# Patient Record
Sex: Female | Born: 1995 | Race: White | Hispanic: No | Marital: Single | Smoking: Former smoker
Health system: Southern US, Community
[De-identification: ages and names within clinical notes are randomized; demographics above are authoritative.]

## PROBLEM LIST (undated history)

## (undated) DIAGNOSIS — T7840XA Allergy, unspecified, initial encounter: Secondary | ICD-10-CM

## (undated) DIAGNOSIS — R87619 Unspecified abnormal cytological findings in specimens from cervix uteri: Secondary | ICD-10-CM

## (undated) DIAGNOSIS — K589 Irritable bowel syndrome without diarrhea: Secondary | ICD-10-CM

## (undated) DIAGNOSIS — L509 Urticaria, unspecified: Secondary | ICD-10-CM

## (undated) DIAGNOSIS — F32A Depression, unspecified: Secondary | ICD-10-CM

## (undated) DIAGNOSIS — T783XXA Angioneurotic edema, initial encounter: Secondary | ICD-10-CM

## (undated) DIAGNOSIS — F419 Anxiety disorder, unspecified: Secondary | ICD-10-CM

## (undated) DIAGNOSIS — R011 Cardiac murmur, unspecified: Secondary | ICD-10-CM

## (undated) DIAGNOSIS — F329 Major depressive disorder, single episode, unspecified: Secondary | ICD-10-CM

## (undated) HISTORY — PX: LEEP: SHX91

## (undated) HISTORY — DX: Major depressive disorder, single episode, unspecified: F32.9

## (undated) HISTORY — DX: Irritable bowel syndrome without diarrhea: K58.9

## (undated) HISTORY — DX: Anxiety disorder, unspecified: F41.9

## (undated) HISTORY — PX: COLPOSCOPY: SHX161

## (undated) HISTORY — PX: WRIST SURGERY: SHX841

## (undated) HISTORY — DX: Urticaria, unspecified: L50.9

## (undated) HISTORY — DX: Depression, unspecified: F32.A

## (undated) HISTORY — DX: Angioneurotic edema, initial encounter: T78.3XXA

## (undated) HISTORY — DX: Allergy, unspecified, initial encounter: T78.40XA

## (undated) HISTORY — DX: Unspecified abnormal cytological findings in specimens from cervix uteri: R87.619

## (undated) HISTORY — DX: Cardiac murmur, unspecified: R01.1

---

## 2015-12-03 DIAGNOSIS — Z3040 Encounter for surveillance of contraceptives, unspecified: Secondary | ICD-10-CM | POA: Insufficient documentation

## 2015-12-03 DIAGNOSIS — L709 Acne, unspecified: Secondary | ICD-10-CM | POA: Insufficient documentation

## 2015-12-03 DIAGNOSIS — R198 Other specified symptoms and signs involving the digestive system and abdomen: Secondary | ICD-10-CM | POA: Insufficient documentation

## 2015-12-12 DIAGNOSIS — B373 Candidiasis of vulva and vagina: Secondary | ICD-10-CM | POA: Insufficient documentation

## 2015-12-12 DIAGNOSIS — Z8742 Personal history of other diseases of the female genital tract: Secondary | ICD-10-CM | POA: Insufficient documentation

## 2015-12-12 DIAGNOSIS — B3731 Acute candidiasis of vulva and vagina: Secondary | ICD-10-CM | POA: Insufficient documentation

## 2015-12-30 DIAGNOSIS — R202 Paresthesia of skin: Secondary | ICD-10-CM | POA: Insufficient documentation

## 2015-12-30 DIAGNOSIS — F419 Anxiety disorder, unspecified: Secondary | ICD-10-CM | POA: Insufficient documentation

## 2015-12-30 DIAGNOSIS — M542 Cervicalgia: Secondary | ICD-10-CM | POA: Insufficient documentation

## 2015-12-30 DIAGNOSIS — F321 Major depressive disorder, single episode, moderate: Secondary | ICD-10-CM | POA: Insufficient documentation

## 2016-01-01 ENCOUNTER — Ambulatory Visit
Admission: RE | Admit: 2016-01-01 | Discharge: 2016-01-01 | Disposition: A | Discharge status: Home / Self Care | Payer: Federal, State, Local not specified - PPO | Source: Ambulatory Visit | Attending: *Deleted | Admitting: *Deleted

## 2016-01-01 ENCOUNTER — Other Ambulatory Visit: Payer: Self-pay | Admitting: *Deleted

## 2016-01-01 DIAGNOSIS — M542 Cervicalgia: Secondary | ICD-10-CM

## 2016-01-01 DIAGNOSIS — R202 Paresthesia of skin: Secondary | ICD-10-CM

## 2016-01-07 DIAGNOSIS — B354 Tinea corporis: Secondary | ICD-10-CM | POA: Insufficient documentation

## 2016-01-07 DIAGNOSIS — J029 Acute pharyngitis, unspecified: Secondary | ICD-10-CM | POA: Insufficient documentation

## 2016-01-30 ENCOUNTER — Other Ambulatory Visit: Payer: Self-pay | Admitting: Family Medicine

## 2016-01-30 DIAGNOSIS — Z3402 Encounter for supervision of normal first pregnancy, second trimester: Secondary | ICD-10-CM

## 2016-01-31 ENCOUNTER — Ambulatory Visit: Payer: Federal, State, Local not specified - PPO

## 2016-05-06 ENCOUNTER — Ambulatory Visit: Payer: Self-pay | Admitting: Family Medicine

## 2016-06-20 ENCOUNTER — Ambulatory Visit (INDEPENDENT_AMBULATORY_CARE_PROVIDER_SITE_OTHER): Payer: Federal, State, Local not specified - PPO | Admitting: Physician Assistant

## 2016-06-20 VITALS — BP 128/80 | HR 89 | Temp 98.5°F | Resp 17 | Ht 63.0 in | Wt 107.0 lb

## 2016-06-20 DIAGNOSIS — Z30011 Encounter for initial prescription of contraceptive pills: Secondary | ICD-10-CM | POA: Diagnosis not present

## 2016-06-20 DIAGNOSIS — Z23 Encounter for immunization: Secondary | ICD-10-CM | POA: Diagnosis not present

## 2016-06-20 DIAGNOSIS — G4700 Insomnia, unspecified: Secondary | ICD-10-CM | POA: Diagnosis not present

## 2016-06-20 DIAGNOSIS — J309 Allergic rhinitis, unspecified: Secondary | ICD-10-CM | POA: Diagnosis not present

## 2016-06-20 DIAGNOSIS — F341 Dysthymic disorder: Secondary | ICD-10-CM | POA: Diagnosis not present

## 2016-06-20 MED ORDER — LEVONORGESTREL-ETHINYL ESTRAD 0.1-20 MG-MCG PO TABS: 1.0000 | ORAL_TABLET | Freq: Every day | ORAL | 4 refills | Status: DC

## 2016-06-20 MED ORDER — FLUTICASONE PROPIONATE 50 MCG/ACT NA SUSP: 2.0000 | Freq: Every day | NASAL | 11 refills | Status: AC

## 2016-06-20 MED ORDER — MIRTAZAPINE 7.5 MG PO TABS: 7.5000 mg | ORAL_TABLET | Freq: Every day | ORAL | 3 refills | Status: DC

## 2016-06-20 NOTE — Progress Notes (Signed)
06/20/2016 12:29 PM   DOB: [birth year] / MRN: [id number]  SUBJECTIVE:  Amanda Gutierrez is a 21 y.o. female presenting for sleep problems that have been present for "a long time" and have been worsening in the last year. She goes to be around 11-12. She will wake up at 1-2 am and it will take up to 30 minutes for her to fall back to sleep. Sometimes she wakes up to use the restroom or due to a nightmare, but most of the time it is for no reason at all.  This has been getting worse. Denies racing thoughts. Denies snoring. 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   She does have a history of depression and anxiety and was diagnosed with this 4-5 years ago. She does not take medication because she does not want to be dependent on these.   She would like refills of her OCP. She has had pap smears in the past.  She thinks that she has had the HPV series. She is sexually active with men.  She has not missed any birth control.  She is not late   [facility] 06/20/2016 06/20/2016  Decreased Interest 1 0  Down, Depressed, Hopeless 1 1  PHQ - 2 Score 2 1  Altered sleeping 3 -  Tired, decreased energy 3 -  Change in appetite 1 -  Feeling bad or failure about yourself  1 -  Trouble concentrating 1 -  Moving slowly or fidgety/restless 0 -  Suicidal thoughts 0 -  PHQ-9 Score 11 -   GAD 7 : Generalized Anxiety Score 06/20/2016  Nervous, Anxious, on Edge 2  Control/stop worrying 2  Worry too much - different things 2  Trouble relaxing 2  Restless 0  Easily annoyed or irritable 1  Afraid - awful might happen 1  Total GAD 7 Score 10   She is allergic to bee venom.   She  has a past medical history of Allergy; Anxiety; Depression; and Heart murmur.    She  reports that she has been smoking.  She has never used smokeless tobacco. She reports that she drinks alcohol. She  has no sexual activity history on file. The patient  has a past surgical history that  includes Wrist surgery (Left, 2014 or 2015).  Her family history includes Cancer in her father; Diabetes in her mother; Hyperlipidemia in her father and mother; Mental illness in her mother, sister, and sister.  Review of Systems  Constitutional: Negative for chills and fever.  Cardiovascular: Negative for chest pain.  Musculoskeletal: Negative for myalgias.  Skin: Negative for itching and rash.  Neurological: Negative for dizziness.  Psychiatric/Behavioral: Positive for depression. Negative for hallucinations, memory loss, substance abuse and suicidal ideas. The patient is nervous/anxious.     The problem list and medications were reviewed and updated by myself where necessary and exist elsewhere in the encounter.   OBJECTIVE:  BP 128/80   Pulse 89   Temp 98.5 F (36.9 C) (Oral)   Resp 17   Ht 5\' 3"  (1.6 m)   Wt 107 lb (48.5 kg)   LMP 06/01/2016 (Approximate)   SpO2 96%   BMI 18.95 kg/m   Physical Exam  Psychiatric: Her mood appears not anxious. Her affect is not angry, not blunt, not labile and not inappropriate. Her speech is not rapid and/or pressured. She is withdrawn. She is not agitated and not slowed. Cognition and memory are not impaired.  She does not express impulsivity or inappropriate judgment. She exhibits a depressed mood. She expresses no suicidal ideation. She expresses no suicidal plans and no homicidal plans. She exhibits normal recent memory and normal remote memory. She is inattentive.    No results found for this or any previous visit (from the past 72 hour(s)).  No results found.  ASSESSMENT AND PLAN:  Amanda Gutierrez was seen today for sleeping problem and flu vaccine.  Diagnoses and all orders for this visit:  Dysthymia: Long standing, untreated.  Exam matches HPI and likely driving problem 2.  Has tried SNRI in the past without relief.  Will try Remeron given sleep benefit and quicker onset of action.   -     mirtazapine (REMERON) 7.5 MG tablet; Take 1 tablet  (7.5 mg total) by mouth at bedtime. -     TSH -     CBC  Insomnia, unspecified type: See problem 1.   Initiation of OCP (BCP) -     levonorgestrel-ethinyl estradiol (AVIANE,ALESSE,LESSINA) 0.1-20 MG-MCG tablet; Take 1 tablet by mouth daily.  Chronic allergic rhinitis, unspecified seasonality, unspecified trigger -     fluticasone (FLONASE) 50 MCG/ACT nasal spray; Place 2 sprays into both nostrils daily.  Needs flu shot -     Flu Vaccine QUAD 36+ mos IM    The patient is advised to call or return to clinic if she does not see an improvement in symptoms, or to seek the care of the closest emergency department if she worsens with the above plan.   Deliah Boston, MHS, PA-C [facility] 06/20/2016 12:29 PM

## 2016-06-20 NOTE — Patient Instructions (Addendum)
  Come back in three weeks for recheck.     IF you received an x-ray today, you will receive an invoice from [facility]. Please contact [facility] at [phone] with questions or concerns regarding your invoice.   IF you received labwork today, you will receive an invoice from [facility]. Please contact [facility] at [phone] with questions or concerns regarding your invoice.   Our billing staff will not be able to assist you with questions regarding bills from these companies.  You will be contacted with the lab results as soon as they are available. The fastest way to get your results is to activate your My Chart account. Instructions are located on the last page of this paperwork. If you have not heard from us regarding the results in 2 weeks, please contact this office.

## 2016-06-21 LAB — CBC
Hematocrit: 36.7 % (ref 34.0–46.6)
Hemoglobin: 12.3 g/dL (ref 11.1–15.9)
MCH: 26.9 pg (ref 26.6–33.0)
MCHC: 33.5 g/dL (ref 31.5–35.7)
MCV: 80 fL (ref 79–97)
Platelets: 196 10*3/uL (ref 150–379)
RBC: 4.58 x10E6/uL (ref 3.77–5.28)
RDW: 12.6 % (ref 12.3–15.4)
WBC: 4.6 10*3/uL (ref 3.4–10.8)

## 2016-06-21 LAB — TSH: TSH: 1.95 u[IU]/mL (ref 0.450–4.500)

## 2016-07-28 ENCOUNTER — Other Ambulatory Visit: Payer: Self-pay | Admitting: Physician Assistant

## 2016-08-01 ENCOUNTER — Other Ambulatory Visit: Payer: Self-pay | Admitting: Emergency Medicine

## 2016-08-01 DIAGNOSIS — F341 Dysthymic disorder: Secondary | ICD-10-CM

## 2016-08-17 ENCOUNTER — Ambulatory Visit (INDEPENDENT_AMBULATORY_CARE_PROVIDER_SITE_OTHER): Payer: Federal, State, Local not specified - PPO | Admitting: Physician Assistant

## 2016-08-17 ENCOUNTER — Encounter: Payer: Self-pay | Admitting: Physician Assistant

## 2016-08-17 VITALS — BP 119/82 | HR 89 | Temp 97.6°F | Resp 17 | Ht 63.0 in | Wt 109.0 lb

## 2016-08-17 DIAGNOSIS — R1013 Epigastric pain: Secondary | ICD-10-CM

## 2016-08-17 DIAGNOSIS — Z760 Encounter for issue of repeat prescription: Secondary | ICD-10-CM | POA: Diagnosis not present

## 2016-08-17 LAB — POCT URINALYSIS DIP (MANUAL ENTRY)
Bilirubin, UA: NEGATIVE
Blood, UA: NEGATIVE
Glucose, UA: NEGATIVE mg/dL
Ketones, POC UA: NEGATIVE mg/dL
Leukocytes, UA: NEGATIVE
Nitrite, UA: NEGATIVE
Protein Ur, POC: NEGATIVE mg/dL
Spec Grav, UA: >=1.03 — AB (ref 1.010–1.025)
Urobilinogen, UA: 1 E.U./dL
pH, UA: 5.5 (ref 5.0–8.0)

## 2016-08-17 LAB — POCT CBC
Granulocyte percent: 60.6 %G (ref 37–80)
HCT, POC: 37.7 % (ref 37.7–47.9)
Hemoglobin: 12.9 g/dL (ref 12.2–16.2)
Lymph, poc: 1.9 (ref 0.6–3.4)
MCH, POC: 27.1 pg (ref 27–31.2)
MCHC: 34.2 g/dL (ref 31.8–35.4)
MCV: 79.1 fL — AB (ref 80–97)
MID (cbc): 0.3 (ref 0–0.9)
MPV: 8.7 fL (ref 0–99.8)
POC Granulocyte: 3.5 (ref 2–6.9)
POC LYMPH PERCENT: 33.4 %L (ref 10–50)
POC MID %: 6 %M (ref 0–12)
Platelet Count, POC: 228 10*3/uL (ref 142–424)
RBC: 4.76 M/uL (ref 4.04–5.48)
RDW, POC: 13.5 %
WBC: 5.7 10*3/uL (ref 4.6–10.2)

## 2016-08-17 LAB — POCT URINE PREGNANCY: Preg Test, Ur: NEGATIVE

## 2016-08-17 MED ORDER — LORYNA 3-0.02 MG PO TABS: 1.0000 | ORAL_TABLET | Freq: Every day | ORAL | 3 refills | Status: DC

## 2016-08-17 NOTE — Patient Instructions (Addendum)
  Pick up some zantac 150 tabs at the drug store and take 150 mg night for at least one week.    IF you received an x-ray today, you will receive an invoice from [facility]. Please contact [facility] at [phone] with questions or concerns regarding your invoice.   IF you received labwork today, you will receive an invoice from [facility]. Please contact [facility] at [phone] with questions or concerns regarding your invoice.   Our billing staff will not be able to assist you with questions regarding bills from these companies.  You will be contacted with the lab results as soon as they are available. The fastest way to get your results is to activate your My Chart account. Instructions are located on the last page of this paperwork. If you have not heard from [place] regarding the results in 2 weeks, please contact this office.

## 2016-08-17 NOTE — Progress Notes (Signed)
08/17/2016 3:28 PM   DOB: Aug 22, 1995 / MRN: [id number]  SUBJECTIVE:  Amanda Gutierrez is a 21 y.o. female presenting for "a gall bladder issue." Tells me she was having pain night before last and before this.  Describes the pain a "sharp" epigastric without radiation and last a few minutes then resolves.  She is not sure if eating makes the pain worse.  She ate today and did not have any problems. She does report having two beers when the pain started but no alcohol before.  No known history of pancreatitis and elevated triglycerides. She was not nausea, is having normal bowel movements.  She is taking birth control and not missing doses. She tells me that she has been having some sporadic heart burn but not in the last week.  Has tried Tums and this did not help.   She is allergic to bee venom.   She  has a past medical history of Allergy; Anxiety; Depression; and Heart murmur.    She  reports that she has been smoking.  She has never used smokeless tobacco. She reports that she drinks alcohol. She  has no sexual activity history on file. The patient  has a past surgical history that includes Wrist surgery (Left, 2014 or 2015).  Her family history includes Cancer in her father; Diabetes in her mother; Hyperlipidemia in her father and mother; Mental illness in her mother, sister, and sister.  Review of Systems  Gastrointestinal: Positive for heartburn. Negative for abdominal pain (at present), blood in stool, constipation, diarrhea, melena, nausea and vomiting.  Neurological: Negative for dizziness.    The problem list and medications were reviewed and updated by myself where necessary and exist elsewhere in the encounter.   OBJECTIVE:  BP 119/82 (BP Location: Right Arm, Patient Position: Sitting, Cuff Size: Normal)   Pulse 89   Temp 97.6 F (36.4 C) (Oral)   Resp 17   Ht 5' 3"  (1.6 m)   Wt 109 lb (49.4 kg)   LMP 07/18/2016   SpO2 96%   BMI 19.31 kg/m   Physical Exam  Constitutional:  She is active.  Non-toxic appearance.  Cardiovascular: Normal rate, regular rhythm, S1 normal, S2 normal, normal heart sounds and intact distal pulses.  Exam reveals no gallop, no friction rub and no decreased pulses.   No murmur heard. Pulmonary/Chest: Effort normal. No stridor. No tachypnea. No respiratory distress. She has no wheezes. She has no rales.  Abdominal: Soft. Normal appearance and bowel sounds are normal. She exhibits no distension and no mass. There is no tenderness. There is no rigidity, no rebound, no guarding and no CVA tenderness.  Musculoskeletal: She exhibits no edema.  Neurological: She is alert.  Skin: Skin is warm and dry. She is not diaphoretic. No pallor.    Results for orders placed or performed in visit on 08/17/16 (from the past 72 hour(s))  POCT CBC     Status: Abnormal   Collection Time: 08/17/16  1:12 PM  Result Value Ref Range   WBC 5.7 4.6 - 10.2 K/uL   Lymph, poc 1.9 0.6 - 3.4   POC LYMPH PERCENT 33.4 10 - 50 %L   MID (cbc) 0.3 0 - 0.9   POC MID % 6.0 0 - 12 %M   POC Granulocyte 3.5 2 - 6.9   Granulocyte percent 60.6 37 - 80 %G   RBC 4.76 4.04 - 5.48 M/uL   Hemoglobin 12.9 12.2 - 16.2 g/dL   HCT, POC 37.7 37.7 -  47.9 %   MCV 79.1 (A) 80 - 97 fL   MCH, POC 27.1 27 - 31.2 pg   MCHC 34.2 31.8 - 35.4 g/dL   RDW, POC 13.5 %   Platelet Count, POC 228 142 - 424 K/uL   MPV 8.7 0 - 99.8 fL  POCT urinalysis dipstick     Status: Abnormal   Collection Time: 08/17/16  1:13 PM  Result Value Ref Range   Color, UA yellow yellow   Clarity, UA clear clear   Glucose, UA negative negative mg/dL   Bilirubin, UA negative negative   Ketones, POC UA negative negative mg/dL   Spec Grav, UA >=1.030 (A) 1.010 - 1.025   Blood, UA negative negative   pH, UA 5.5 5.0 - 8.0   Protein Ur, POC negative negative mg/dL   Urobilinogen, UA 1.0 0.2 or 1.0 E.U./dL   Nitrite, UA Negative Negative   Leukocytes, UA Negative Negative  POCT urine pregnancy     Status: None    Collection Time: 08/17/16  1:14 PM  Result Value Ref Range   Preg Test, Ur Negative Negative    No results found.  ASSESSMENT AND PLAN:  Amanda Gutierrez was seen today for abdominal pain.  Diagnoses and all orders for this visit:  Abdominal pain, epigastric: Her symptoms sound like GERD. I will screen standard labs and am adding a H. Pylori. Hopefully I will find something to treat, however will not be surprised if her labs are completely normal as he pain seems to have resolved and her exam here is negative. I have advised that she try taking ranitidine 150 ghs OTC while we are waiting for work up.  -     POCT CBC -     CMP14+EGFR -     POCT urine pregnancy -     Lipase -     POCT urinalysis dipstick -     H. pylori breath test  Medication refill -     LORYNA 3-0.02 MG tablet; Take 1 tablet by mouth daily.    The patient is advised to call or return to clinic if she does not see an improvement in symptoms, or to seek the care of the closest emergency department if she worsens with the above plan.   Amanda Gutierrez, MHS, PA-C [facility] 08/17/2016 3:28 PM

## 2016-08-18 LAB — CMP14+EGFR
ALT: 8 IU/L (ref 0–32)
AST: 20 IU/L (ref 0–40)
Albumin/Globulin Ratio: 1.7 (ref 1.2–2.2)
Albumin: 4.4 g/dL (ref 3.5–5.5)
Alkaline Phosphatase: 46 IU/L (ref 39–117)
BUN/Creatinine Ratio: 11 (ref 9–23)
BUN: 9 mg/dL (ref 6–20)
Bilirubin Total: 0.4 mg/dL (ref 0.0–1.2)
CO2: 24 mmol/L (ref 18–29)
Calcium: 9.6 mg/dL (ref 8.7–10.2)
Chloride: 100 mmol/L (ref 96–106)
Creatinine, Ser: 0.79 mg/dL (ref 0.57–1.00)
GFR calc Af Amer: 124 mL/min/{1.73_m2} (ref >59)
GFR calc non Af Amer: 107 mL/min/{1.73_m2} (ref >59)
Globulin, Total: 2.6 g/dL (ref 1.5–4.5)
Glucose: 94 mg/dL (ref 65–99)
Potassium: 4.4 mmol/L (ref 3.5–5.2)
Sodium: 140 mmol/L (ref 134–144)
Total Protein: 7 g/dL (ref 6.0–8.5)

## 2016-08-18 LAB — H. PYLORI BREATH TEST: H. pylori UBiT: POSITIVE — AB

## 2016-08-18 LAB — LIPASE: Lipase: 51 U/L (ref 14–72)

## 2016-11-27 ENCOUNTER — Telehealth: Payer: Self-pay | Admitting: Physician Assistant

## 2016-11-27 NOTE — Telephone Encounter (Signed)
[facility] [address], I think the patient stated that it is the [place] medication.  Her call back number is [phone]

## 2016-11-30 ENCOUNTER — Other Ambulatory Visit: Payer: Self-pay | Admitting: Emergency Medicine

## 2016-11-30 DIAGNOSIS — Z760 Encounter for issue of repeat prescription: Secondary | ICD-10-CM

## 2016-11-30 MED ORDER — LORYNA 3-0.02 MG PO TABS: 1.0000 | ORAL_TABLET | Freq: Every day | ORAL | 3 refills | Status: DC

## 2016-11-30 NOTE — Telephone Encounter (Signed)
Pt aware medication e-scribed to pharmacy

## 2016-12-01 NOTE — Telephone Encounter (Signed)
PATIENT SAID THE WRONG MEDICATION WAS CALLED INTO HER PHARMACY. WE CALLED  IN HER BIRTH CONTROL PILLS. SHE NEEDS THE MEDICINE SHE TAKES FOR HER IBS. IT IS CALLED DICYCLOMINE 20 MG. IT WAS PREVIOUSLY GIV[facility] TO HER [facility]. SHE SAID MICHAEL HAS SEEN HER FOR HER STOMACH PROBLEMS. BEST PHONE [phone] (CELL) [facility] [address]. MBC

## 2016-12-04 ENCOUNTER — Emergency Department (HOSPITAL_COMMUNITY)
Admission: EM | Admit: 2016-12-04 | Discharge: 2016-12-04 | Discharge status: Against Medical Advice | Payer: Federal, State, Local not specified - PPO | Attending: Emergency Medicine | Admitting: Emergency Medicine

## 2016-12-04 ENCOUNTER — Encounter (HOSPITAL_COMMUNITY): Payer: Self-pay | Admitting: Emergency Medicine

## 2016-12-04 ENCOUNTER — Telehealth: Payer: Self-pay | Admitting: Physician Assistant

## 2016-12-04 DIAGNOSIS — Z5321 Procedure and treatment not carried out due to patient leaving prior to being seen by health care provider: Secondary | ICD-10-CM | POA: Insufficient documentation

## 2016-12-04 DIAGNOSIS — R109 Unspecified abdominal pain: Secondary | ICD-10-CM | POA: Diagnosis present

## 2016-12-04 NOTE — Telephone Encounter (Signed)
Pt called stating that she is still waiting on her Dicyclimine refill. [facility] [address]. Please advise

## 2016-12-04 NOTE — Telephone Encounter (Signed)
Please advise 

## 2016-12-04 NOTE — ED Triage Notes (Signed)
Pt was seen at [facility] this morning and was sent over for RLQ pain and vomiting since this morning.  States that they were concerned about appy.

## 2016-12-04 NOTE — Telephone Encounter (Signed)
Please give her a call and tee up the correct medication so we can sign for a year. Deliah Boston, MS, PA-C 9:37 AM, 12/04/2016

## 2016-12-05 NOTE — Telephone Encounter (Signed)
Can you please call patient.

## 2016-12-09 ENCOUNTER — Encounter: Payer: Self-pay | Admitting: Physician Assistant

## 2016-12-09 ENCOUNTER — Ambulatory Visit (INDEPENDENT_AMBULATORY_CARE_PROVIDER_SITE_OTHER): Payer: Federal, State, Local not specified - PPO | Admitting: Physician Assistant

## 2016-12-09 VITALS — BP 106/67 | HR 71 | Temp 98.0°F | Resp 16 | Ht 63.0 in | Wt 107.8 lb

## 2016-12-09 DIAGNOSIS — Z124 Encounter for screening for malignant neoplasm of cervix: Secondary | ICD-10-CM | POA: Diagnosis not present

## 2016-12-09 DIAGNOSIS — R109 Unspecified abdominal pain: Secondary | ICD-10-CM

## 2016-12-09 LAB — POCT WET + KOH PREP
Trich by wet prep: ABSENT
Yeast by KOH: ABSENT
Yeast by wet prep: ABSENT

## 2016-12-09 LAB — POCT URINALYSIS DIP (MANUAL ENTRY)
Bilirubin, UA: NEGATIVE
Blood, UA: NEGATIVE
Glucose, UA: NEGATIVE mg/dL
Ketones, POC UA: NEGATIVE mg/dL
Leukocytes, UA: NEGATIVE
Nitrite, UA: NEGATIVE
Protein Ur, POC: NEGATIVE mg/dL
Spec Grav, UA: 1.02 (ref 1.010–1.025)
Urobilinogen, UA: 0.2 E.U./dL
pH, UA: 6.5 (ref 5.0–8.0)

## 2016-12-09 LAB — POCT URINE PREGNANCY: Preg Test, Ur: NEGATIVE

## 2016-12-09 MED ORDER — DICYCLOMINE HCL 20 MG PO TABS: 20.0000 mg | ORAL_TABLET | Freq: Three times a day (TID) | ORAL | 3 refills | Status: DC | PRN

## 2016-12-09 NOTE — Patient Instructions (Signed)
     IF you received an x-ray today, you will receive an invoice from [facility]. Please contact [facility] at [phone] with questions or concerns regarding your invoice.   IF you received labwork today, you will receive an invoice from [facility]. Please contact [facility] at [phone] with questions or concerns regarding your invoice.   Our billing staff will not be able to assist you with questions regarding bills from these companies.  You will be contacted with the lab results as soon as they are available. The fastest way to get your results is to activate your My Chart account. Instructions are located on the last page of this paperwork. If you have not heard from us regarding the results in 2 weeks, please contact this office.     

## 2016-12-09 NOTE — Progress Notes (Signed)
I performed a pelvic and bimanual exam on patient. There was mild amount of white creamy discharge in vaginal canal. Transformation zone noted upon visualization of cervix. Cervix is friable. There was no pain noted with bimanual exam. No masses of bilateral ovaries or uterus noted.   Benjiman Core, PA-C  Primary [facility] at [facility] 12/09/2016 6:11 PM

## 2016-12-09 NOTE — Progress Notes (Signed)
12/09/2016 6:25 PM   DOB: [birth year] / MRN: [id number]  SUBJECTIVE:  Amanda Gutierrez is a 21 y.o. female presenting for a history of IBS and has taken dicyclomine in the past did help her symptoms with excellent relief.  No work up has been done for that problem.   She had some abnromal abdominal pain and tells me that she went to the ED were and CT and work up was preformed and that was negative.  She was given no answers as to the etiology of her pain at that time. Tells me this pain has improved. She denies pain with urination at the presentation to the ED. She has not traveled.    She  has a past medical history of Allergy; Anxiety; Depression; and Heart murmur.    She  reports that she has been smoking.  She has never used smokeless tobacco. She reports that she drinks alcohol. She  has no sexual activity history on file. The patient  has a past surgical history that includes Wrist surgery (Left, 2014 or 2015).  Her family history includes Cancer in her father; Diabetes in her mother; Hyperlipidemia in her father and mother; Mental illness in her mother, sister, and sister.  Review of Systems  Constitutional: Negative for chills, diaphoresis and fever.  Respiratory: Negative for cough, hemoptysis, sputum production, shortness of breath and wheezing.   Cardiovascular: Negative for chest pain, orthopnea and leg swelling.  Gastrointestinal: Negative for abdominal pain, blood in stool, constipation, diarrhea, heartburn, melena, nausea and vomiting.  Genitourinary: Negative for flank pain.  Skin: Negative for rash.  Neurological: Negative for dizziness.    The problem list and medications were reviewed and updated by myself where necessary and exist elsewhere in the encounter.   OBJECTIVE:  BP 106/67   Pulse 71   Temp 98 F (36.7 C) (Oral)   Resp 16   Ht 5\' 3"  (1.6 m)   Wt 107 lb 12.8 oz (48.9 kg)   LMP 11/08/2016   SpO2 98%   BMI 19.10 kg/m   Physical Exam  Constitutional:  She is active.  Non-toxic appearance.  Cardiovascular: Normal rate, regular rhythm, S1 normal, S2 normal, normal heart sounds and intact distal pulses.  Exam reveals no gallop, no friction rub and no decreased pulses.   No murmur heard. Pulmonary/Chest: Effort normal. No stridor. No tachypnea. No respiratory distress. She has no wheezes. She has no rales.  Abdominal: She exhibits no distension.  Musculoskeletal: She exhibits no edema.  Neurological: She is alert.  Skin: Skin is warm and dry. She is not diaphoretic. No pallor.    Results for orders placed or performed in visit on 12/09/16 (from the past 72 hour(s))  POCT urinalysis dipstick     Status: Abnormal   Collection Time: 12/09/16  3:54 PM  Result Value Ref Range   Color, UA yellow yellow   Clarity, UA cloudy (A) clear   Glucose, UA negative negative mg/dL   Bilirubin, UA negative negative   Ketones, POC UA negative negative mg/dL   Spec Grav, UA 5.449 2.010 - 1.025   Blood, UA negative negative   pH, UA 6.5 5.0 - 8.0   Protein Ur, POC negative negative mg/dL   Urobilinogen, UA 0.2 0.2 or 1.0 E.U./dL   Nitrite, UA Negative Negative   Leukocytes, UA Negative Negative  POCT Wet + KOH Prep     Status: None   Collection Time: 12/09/16  4:39 PM  Result Value Ref Range  Yeast by KOH Absent Absent   Yeast by wet prep Absent Absent   WBC by wet prep Few Few   Clue Cells Wet Prep HPF POC None None   Trich by wet prep Absent Absent   Bacteria Wet Prep HPF POC Few Few   Epithelial Cells By [employer] ([facility]) Few None, Few, Too numerous to count   RBC,UR,HPF,POC None None RBC/hpf  POCT urine pregnancy     Status: None   Collection Time: 12/09/16  4:56 PM  Result Value Ref Range   Preg Test, Ur Negative Negative    No results found.  ASSESSMENT AND PLAN:  Amanda Gutierrez was seen today for irritable bowel syndrome and abdominal pain.  Diagnoses and all orders for this visit:  Stomach pain -     dicyclomine (BENTYL) 20 MG tablet;  Take 1 tablet (20 mg total) by mouth 3 (three) times daily as needed. -     CBC -     POCT urinalysis dipstick -     CMP and Liver -     Sedimentation Rate -     C-reactive protein -     Tissue Transglutaminase Abs,IgG,IgA -     POCT Wet + KOH Prep -     POCT urine pregnancy  Screening for cervical cancer -     Pap IG, CT/NG w/ reflex HPV when ASC-U [employer])    The patient is advised to call or return to clinic if she does not see an improvement in symptoms, or to seek the care of the closest emergency department if she worsens with the above plan.   Deliah Boston, MHS, PA-C [facility] at [facility] 12/09/2016 6:25 PM

## 2016-12-10 LAB — CMP AND LIVER
ALT: 7 IU/L (ref 0–32)
AST: 15 IU/L (ref 0–40)
Albumin: 4 g/dL (ref 3.5–5.5)
Alkaline Phosphatase: 38 IU/L — ABNORMAL LOW (ref 39–117)
BUN: 7 mg/dL (ref 6–20)
Bilirubin Total: <0.2 mg/dL (ref 0.0–1.2)
Bilirubin, Direct: 0.06 mg/dL (ref 0.00–0.40)
CO2: 22 mmol/L (ref 20–29)
Calcium: 9.1 mg/dL (ref 8.7–10.2)
Chloride: 103 mmol/L (ref 96–106)
Creatinine, Ser: 0.72 mg/dL (ref 0.57–1.00)
GFR calc Af Amer: 138 mL/min/{1.73_m2} (ref >59)
GFR calc non Af Amer: 120 mL/min/{1.73_m2} (ref >59)
Glucose: 78 mg/dL (ref 65–99)
Potassium: 4 mmol/L (ref 3.5–5.2)
Sodium: 141 mmol/L (ref 134–144)
Total Protein: 6.4 g/dL (ref 6.0–8.5)

## 2016-12-10 LAB — C-REACTIVE PROTEIN: CRP: 2.8 mg/L (ref 0.0–4.9)

## 2016-12-10 LAB — TISSUE TRANSGLUTAMINASE ABS,IGG,IGA
Tissue Transglut Ab: <2 U/mL (ref 0–5)
Transglutaminase IgA: <2 U/mL (ref 0–3)

## 2016-12-10 LAB — CBC
Hematocrit: 36.9 % (ref 34.0–46.6)
Hemoglobin: 12.2 g/dL (ref 11.1–15.9)
MCH: 26.6 pg (ref 26.6–33.0)
MCHC: 33.1 g/dL (ref 31.5–35.7)
MCV: 81 fL (ref 79–97)
Platelets: 233 10*3/uL (ref 150–379)
RBC: 4.58 x10E6/uL (ref 3.77–5.28)
RDW: 14.1 % (ref 12.3–15.4)
WBC: 5.2 10*3/uL (ref 3.4–10.8)

## 2016-12-10 LAB — SEDIMENTATION RATE: Sed Rate: 5 mm/hr (ref 0–32)

## 2016-12-11 ENCOUNTER — Ambulatory Visit (INDEPENDENT_AMBULATORY_CARE_PROVIDER_SITE_OTHER): Payer: Federal, State, Local not specified - PPO | Admitting: Physician Assistant

## 2016-12-11 VITALS — BP 97/68 | HR 96 | Temp 98.0°F | Resp 16 | Ht 63.0 in | Wt 104.4 lb

## 2016-12-11 DIAGNOSIS — G8929 Other chronic pain: Secondary | ICD-10-CM

## 2016-12-11 DIAGNOSIS — R1013 Epigastric pain: Secondary | ICD-10-CM | POA: Diagnosis not present

## 2016-12-11 LAB — PAP IG, CT-NG, RFX HPV ASCU
Chlamydia, Nuc. Acid Amp: NEGATIVE
Gonococcus by Nucleic Acid Amp: NEGATIVE
PAP Smear Comment: 0

## 2016-12-11 MED ORDER — PANTOPRAZOLE SODIUM 40 MG PO TBEC: 40.0000 mg | DELAYED_RELEASE_TABLET | Freq: Every day | ORAL | 3 refills | Status: DC

## 2016-12-11 MED ORDER — TRAMADOL HCL 50 MG PO TABS: 50.0000 mg | ORAL_TABLET | Freq: Three times a day (TID) | ORAL | 0 refills | Status: DC | PRN

## 2016-12-11 MED ORDER — FAMOTIDINE 20 MG PO TABS: 20.0000 mg | ORAL_TABLET | Freq: Every day | ORAL | 1 refills | Status: DC

## 2016-12-11 NOTE — Progress Notes (Signed)
12/11/2016 12:21 PM   DOB: 1996-03-28 / MRN: [id number]  SUBJECTIVE:  Amanda Gutierrez is a 21 y.o. female presenting for abdominal pain.  Tells me that eating will make the pain worse. She is moving her bowels.Dicyclomine is not really helping.  She has a history of IBS.  Most recent inflammatory markers were negative.  Celiac negative. I have already treated her for H. Pylori and she completed that treatment. She avoids NSAIDS.  Her work up looked great previously.   She is allergic to bee venom.   She  has a past medical history of Allergy; Anxiety; Depression; and Heart murmur.    She  reports that she has been smoking.  She has never used smokeless tobacco. She reports that she drinks alcohol. She  has no sexual activity history on file. The patient  has a past surgical history that includes Wrist surgery (Left, 2014 or 2015).  Her family history includes Cancer in her father; Diabetes in her mother; Hyperlipidemia in her father and mother; Mental illness in her mother, sister, and sister.  Review of Systems  Constitutional: Negative for chills, diaphoresis and fever.  Respiratory: Negative for cough, hemoptysis, sputum production, shortness of breath and wheezing.   Cardiovascular: Negative for chest pain, orthopnea and leg swelling.  Gastrointestinal: Positive for abdominal pain, constipation and diarrhea. Negative for blood in stool, heartburn, melena, nausea and vomiting.  Skin: Negative for rash.  Neurological: Negative for dizziness.    The problem list and medications were reviewed and updated by myself where necessary and exist elsewhere in the encounter.   OBJECTIVE:  BP 97/68   Pulse 96   Temp 98 F (36.7 C) (Oral)   Resp 16   Ht 5\' 3"  (1.6 m)   Wt 104 lb 6.4 oz (47.4 kg)   LMP 11/10/2016   SpO2 98%   BMI 18.49 kg/m   Physical Exam  Constitutional: She is oriented to person, place, and time. She is active.  Non-toxic appearance.  Eyes: Pupils are equal, round,  and reactive to light. EOM are normal.  Cardiovascular: Normal rate, regular rhythm, S1 normal, S2 normal, normal heart sounds and intact distal pulses.  Exam reveals no gallop, no friction rub and no decreased pulses.   No murmur heard. Pulmonary/Chest: Effort normal. No stridor. No tachypnea. No respiratory distress. She has no wheezes. She has no rales.  Abdominal: She exhibits no distension.  Musculoskeletal: She exhibits no edema.  Neurological: She is alert and oriented to person, place, and time. She has normal strength and normal reflexes. She is not disoriented. She displays no atrophy. No cranial nerve deficit or sensory deficit. She exhibits normal muscle tone. Coordination and gait normal.  Skin: Skin is warm and dry. She is not diaphoretic. No pallor.  Psychiatric: Her behavior is normal.    Results for orders placed or performed in visit on 12/09/16 (from the past 72 hour(s))  Pap IG, CT/NG w/ reflex HPV when ASC-U [employer])     Status: None (Preliminary result)   Collection Time: 12/09/16  3:48 PM  Result Value Ref Range   DIAGNOSIS: WILL FOLLOW    PAP NOTE: WILL FOLLOW    Note: WILL FOLLOW    PAP Reflex WILL FOLLOW    Chlamydia, Nuc. Acid Amp Negative Negative   Gonococcus by Nucleic Acid Amp Negative Negative  POCT urinalysis dipstick     Status: Abnormal   Collection Time: 12/09/16  3:54 PM  Result Value Ref Range  Color, UA yellow yellow   Clarity, UA cloudy (A) clear   Glucose, UA negative negative mg/dL   Bilirubin, UA negative negative   Ketones, POC UA negative negative mg/dL   Spec Grav, UA 3.086 5.784 - 1.025   Blood, UA negative negative   pH, UA 6.5 5.0 - 8.0   Protein Ur, POC negative negative mg/dL   Urobilinogen, UA 0.2 0.2 or 1.0 E.U./dL   Nitrite, UA Negative Negative   Leukocytes, UA Negative Negative  POCT Wet + KOH Prep     Status: None   Collection Time: 12/09/16  4:39 PM  Result Value Ref Range   Yeast by KOH Absent Absent   Yeast  by wet prep Absent Absent   WBC by wet prep Few Few   Clue Cells Wet Prep HPF POC None None   Trich by wet prep Absent Absent   Bacteria Wet Prep HPF POC Few Few   Epithelial Cells By [employer] ([facility]) Few None, Few, Too numerous to count   RBC,UR,HPF,POC None None RBC/hpf  POCT urine pregnancy     Status: None   Collection Time: 12/09/16  4:56 PM  Result Value Ref Range   Preg Test, Ur Negative Negative  CBC     Status: None   Collection Time: 12/09/16  4:57 PM  Result Value Ref Range   WBC 5.2 3.4 - 10.8 x10E3/uL   RBC 4.58 3.77 - 5.28 x10E6/uL   Hemoglobin 12.2 11.1 - 15.9 g/dL   Hematocrit 69.6 29.5 - 46.6 %   MCV 81 79 - 97 fL   MCH 26.6 26.6 - 33.0 pg   MCHC 33.1 31.5 - 35.7 g/dL   RDW 28.4 13.2 - 44.0 %   Platelets 233 150 - 379 x10E3/uL  CMP and Liver     Status: Abnormal   Collection Time: 12/09/16  4:57 PM  Result Value Ref Range   Glucose 78 65 - 99 mg/dL   BUN 7 6 - 20 mg/dL   Creatinine, Ser 1.02 0.57 - 1.00 mg/dL   GFR calc non Af Amer 120 >59 mL/min/1.73   GFR calc Af Amer 138 >59 mL/min/1.73   Sodium 141 134 - 144 mmol/L   Potassium 4.0 3.5 - 5.2 mmol/L   Chloride 103 96 - 106 mmol/L   CO2 22 20 - 29 mmol/L   Calcium 9.1 8.7 - 10.2 mg/dL   Total Protein 6.4 6.0 - 8.5 g/dL   Albumin 4.0 3.5 - 5.5 g/dL   Bilirubin Total <7.2 0.0 - 1.2 mg/dL   Bilirubin, Direct 5.36 0.00 - 0.40 mg/dL   Alkaline Phosphatase 38 (L) 39 - 117 IU/L   AST 15 0 - 40 IU/L   ALT 7 0 - 32 IU/L  Sedimentation Rate     Status: None   Collection Time: 12/09/16  4:57 PM  Result Value Ref Range   Sed Rate 5 0 - 32 mm/hr  C-reactive protein     Status: None   Collection Time: 12/09/16  4:57 PM  Result Value Ref Range   CRP 2.8 0.0 - 4.9 mg/L  Tissue Transglutaminase Abs,IgG,IgA     Status: None   Collection Time: 12/09/16  4:57 PM  Result Value Ref Range   Transglutaminase IgA <2 0 - 3 U/mL    Comment:                               Negative  0 -  3                                Weak Positive   4 - 10                               Positive           >10  Tissue Transglutaminase (tTG) has been identified  as the endomysial antigen.  Studies have demonstr-  ated that endomysial IgA antibodies have over 99%  specificity for gluten sensitive enteropathy.    Tissue Transglut Ab <2 0 - 5 U/mL    Comment:                               Negative        0 - 5                               Weak Positive   6 - 9                               Positive           >9     No results found.  ASSESSMENT AND PLAN:  Amanda Gutierrez was seen today for abdominal pain, follow-up and discuss lab results.  Diagnoses and all orders for this visit:  Abdominal pain, chronic, epigastric: I don't have an explanation of her symptoms.  Will start acid therapy and get her to GI for further workup.  I did offer to try SSRI therapy however she would like to wait on specialist opinion before starting this.  -     H. pylori breath test -     pantoprazole (PROTONIX) 40 MG tablet; Take 1 tablet (40 mg total) by mouth daily. -     famotidine (PEPCID) 20 MG tablet; Take 1 tablet (20 mg total) by mouth at bedtime. Take nightly -     traMADol (ULTRAM) 50 MG tablet; Take 1 tablet (50 mg total) by mouth every 8 (eight) hours as needed. -     Ambulatory referral to Gastroenterology    The patient is advised to call or return to clinic if she does not see an improvement in symptoms, or to seek the care of the closest emergency department if she worsens with the above plan.   Deliah Boston, MHS, PA-C [facility] at [facility] 12/11/2016 12:21 PM

## 2016-12-11 NOTE — Patient Instructions (Addendum)
Take the pantoprazole in the morning before eating, and then eat thirty minutes later.  Take the famotidine at night before bed.  Continue your dicyclomine as needed.  Use tylenol mostly for aches and pains, and use tramadol sparingly.     IF you received an x-ray today, you will receive an invoice from [facility]. Please contact [facility] at [phone] with questions or concerns regarding your invoice.   IF you received labwork today, you will receive an invoice from [facility]. Please contact [facility] at [phone] with questions or concerns regarding your invoice.   Our billing staff will not be able to assist you with questions regarding bills from these companies.  You will be contacted with the lab results as soon as they are available. The fastest way to get your results is to activate your My Chart account. Instructions are located on the last page of this paperwork. If you have not heard from [place] regarding the results in 2 weeks, please contact this office.

## 2016-12-14 ENCOUNTER — Telehealth: Payer: Self-pay | Admitting: Physician Assistant

## 2016-12-14 DIAGNOSIS — R87619 Unspecified abnormal cytological findings in specimens from cervix uteri: Secondary | ICD-10-CM

## 2016-12-14 NOTE — Progress Notes (Signed)
Terance Ice, MS, PA-C 11:06 AM, 12/14/2016

## 2016-12-14 NOTE — Telephone Encounter (Signed)
Given recent pap advised that she establish with GYN for colposcopy and potential LEEP.  She agrees.  Her stomach pain gone away since starting PPI and H2 blocker. Deliah Boston, MS, PA-C 11:05 AM, 12/14/2016

## 2016-12-25 ENCOUNTER — Telehealth: Payer: Self-pay | Admitting: Obstetrics and Gynecology

## 2016-12-25 DIAGNOSIS — R87619 Unspecified abnormal cytological findings in specimens from cervix uteri: Secondary | ICD-10-CM

## 2016-12-25 NOTE — Telephone Encounter (Signed)
Called and left a message for patient to call back to schedule a new patient doctor referral. Patient picked up the line and requested I send the referring office the following message:  I called this patient attempting to schedule her for a colposcopy procedure but she declined to schedule at this time. She said she has more questions for the referring office about the referral before she schedules. She also said she was not aware she needed a procedure which doesn't match the documentation in her chart. Please advise once patient is contacted and ready to schedule. If she'd like to just come to our office for a consultation first then come back on another day for the procedure, please let me know.  Message above sent to referring office for follow up.

## 2016-12-29 ENCOUNTER — Telehealth: Payer: Self-pay

## 2016-12-29 NOTE — Telephone Encounter (Signed)
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  I had referred her to GYN after telling her about this however twice she advised that she did not understand the nature of the visit or that there would be a procedure involved.  The procedure will be explained by a gynecologist before proceeding and she can certainly ask questions regarding the procedure at that time, as well as any other options.  I think it's important that she go to the GYN appointment. Please help her understand that it is best that she go. Deliah BostonMichael Clark, MS, PA-C 5:30 PM, 12/28/2016

## 2016-12-29 NOTE — Telephone Encounter (Signed)
Spoke with patient regarding need for gyn consult, patient states that she was not getting any answers from that office prior to scheduling appointment. Patient made aware of provider notes, verbalized understanding and agreement. Patient states that she will make appointment./ S.Phylicia Mcgaugh,CMA

## 2016-12-30 NOTE — Telephone Encounter (Signed)
Spoke with patient. LMP 12/02/16. Contraceptive OCP, no missed or late pills. LMP "around middle of August, 12/02/16, due to start end of next week". Patient scheduled for colposcopy on 01/04/17 at 11:30am with Dr. Oscar LaJertson. Patient aware if menses starts and flow is heavy, return call to office to reschedule. Brief explanation of colposcopy provided, questions answered. Advised to take Motrin 800 mg with food and water one hour before procedure. Patient verbalizes understanding and is agreeable.  Order placed for colposcopy.  Routing to provider for final review. Patient is agreeable to disposition.    Cc: Harland DingwallSuzy Dixon

## 2016-12-30 NOTE — Telephone Encounter (Signed)
Patient is ready to schedule her colposcopy. [facility].

## 2017-01-04 ENCOUNTER — Encounter: Payer: Self-pay | Admitting: Obstetrics and Gynecology

## 2017-01-04 ENCOUNTER — Ambulatory Visit (INDEPENDENT_AMBULATORY_CARE_PROVIDER_SITE_OTHER): Payer: Federal, State, Local not specified - PPO | Admitting: Obstetrics and Gynecology

## 2017-01-04 VITALS — BP 110/70 | HR 72 | Resp 16 | Ht 62.0 in | Wt 105.0 lb

## 2017-01-04 DIAGNOSIS — R87611 Atypical squamous cells cannot exclude high grade squamous intraepithelial lesion on cytologic smear of cervix (ASC-H): Secondary | ICD-10-CM

## 2017-01-04 DIAGNOSIS — R109 Unspecified abdominal pain: Secondary | ICD-10-CM

## 2017-01-04 DIAGNOSIS — R102 Pelvic and perineal pain: Secondary | ICD-10-CM | POA: Diagnosis not present

## 2017-01-04 DIAGNOSIS — K580 Irritable bowel syndrome with diarrhea: Secondary | ICD-10-CM

## 2017-01-04 DIAGNOSIS — Z01812 Encounter for preprocedural laboratory examination: Secondary | ICD-10-CM

## 2017-01-04 DIAGNOSIS — N941 Unspecified dyspareunia: Secondary | ICD-10-CM | POA: Diagnosis not present

## 2017-01-04 LAB — POCT URINE PREGNANCY: Preg Test, Ur: NEGATIVE

## 2017-01-04 NOTE — Patient Instructions (Signed)

## 2017-01-04 NOTE — Progress Notes (Signed)
21 y.o. G0P0000 SingleCaucasianF here for a consultation from Deliah Boston, PA for abnormal PAP   Recent pap with ASC-H, negative GC/CT She has been on OCP's for years. She c/o pain intermittently in BLQ for the last few months. She has a swelling pain, seems to start about a week prior to her cycles and while she is on her cycle. She gets some diarrhea during her cycle. She has IBS, doesn't always take her medications. She typically has a BM qd, but can get diarrhea.  She states that she isn't frequently sexually active secondary to burning pain with sex.  Period Cycle (Days): 28 Period Duration (Days): 3-4 days  Period Pattern: Regular Menstrual Flow: Light, Moderate Menstrual Control: Tampon Menstrual Control Change Freq (Hours): changes tampon 3-4 times a day Dysmenorrhea: (!) Severe Dysmenorrhea Symptoms: Cramping  Patient's last menstrual period was 12/11/2016.          Sexually active: Yes.    The current method of family planning is OCP (estrogen/progesterone).    Exercising: No.  The patient does not participate in regular exercise at present. 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  TDaP:  12-20-15 Gardasil: no    reports that she has been smoking Cigarettes.  She has never used smokeless tobacco. She reports that she drinks about 2.4 - 3.6 oz of alcohol per week . She reports that she does not use drugs.  Past Medical History:  Diagnosis Date  . Allergy   . Anxiety   . Depression   . Heart murmur   . IBS (irritable bowel syndrome)     Past Surgical History:  Procedure Laterality Date  . WRIST SURGERY Left 2014 or 2015    Current Outpatient Prescriptions  Medication Sig Dispense Refill  . cetirizine (ZYRTEC) 10 MG tablet Take 10 mg by mouth daily.    Marland Kitchen dicyclomine (BENTYL) 20 MG tablet Take 1 tablet (20 mg  total) by mouth 3 (three) times daily as needed. 270 tablet 3  . famotidine (PEPCID) 20 MG tablet Take 1 tablet (20 mg total) by mouth at bedtime. Take nightly 30 tablet 1  . fluticasone (FLONASE) 50 MCG/ACT nasal spray Place 2 sprays into both nostrils daily. 16 g 11  . LORYNA 3-0.02 MG tablet Take 1 tablet by mouth daily. 84 tablet 3  . pantoprazole (PROTONIX) 40 MG tablet Take 1 tablet (40 mg total) by mouth daily. 30 tablet 3  . traMADol (ULTRAM) 50 MG tablet Take 1 tablet (50 mg total) by mouth every 8 (eight) hours as needed. 30 tablet 0   No current facility-administered medications for this visit.     Family History  Problem Relation Age of Onset  . Diabetes Mother   . Hyperlipidemia Mother   . Mental illness Mother   . Cancer Father   . Hyperlipidemia Father   . Mental illness Sister   . Mental illness Sister     Review of Systems  Constitutional: Negative.   HENT: Negative.   Eyes: Negative.   Respiratory: Negative.   Cardiovascular: Negative.   Gastrointestinal: Negative.   Endocrine: Negative.   Genitourinary: Negative.   Musculoskeletal: Negative.   Skin: Negative.   Allergic/Immunologic: Negative.   Neurological: Negative.   Psychiatric/Behavioral: Negative.     Exam:   BP 110/70 (BP Location: Right Arm, Patient  Position: Sitting, Cuff Size: Normal)   Pulse 72   Resp 16   Ht  (1.575 m)   Wt 105 lb (47.6 kg)   LMP 12/11/2016   BMI 19.20 kg/m   Weight change: @ Height:   Height:  (157.5 cm)  Ht Readings from Last 3 Encounters:  01/04/17  (1.575 m)  12/11/16  (1.6 m)  12/09/16  (1.6 m)    General appearance: alert, cooperative and appears stated age Abdomen: soft, non-tender;mildly distended, no masses,  no organomegaly. She points to the area of her lower ascending and descending colon as to the location of her pain.    Pelvic: External genitalia:  no lesions, no vestibular tenderness              Urethra:   normal appearing urethra with no masses, tenderness or lesions              Bartholins and Skenes: normal                 Vagina: normal appearing vagina with normal color and discharge, no lesions              Cervix: no cervical motion tenderness and no lesions               Bimanual Exam:  Uterus:  normal size, contour, position, consistency, mobility, non-tender              Adnexa: no masses, tender in the left adnexal region               Pelvic floor: not tender  Bladder: not tender  Colposcopy: satisfactory with use of a cotton swab. Aceto-white changes on the anterior lip of the cervix, biopsies taken at 10 and 12 o'clock, ECC done. Biopsy sites treated with silver nitrate. Negative lugols examination of the upper vagina.   Chaperone was present for exam.  A:  Abnormal pap, ASC-H  Abdominal/pelvic pain, suspect her pain is from her IBS, there is a cyclic nature to her pain  Mild left adnexal tenderness  Dyspareunia, tender with vaginal exam, c/o burning pain      P:   Colposcopy with biopsies and ECC  Discussed HPV, abnormal pa'ps, need for either close f/u or treatment (depending on results)  She is going to f/u with GI for IBS and abdominal pain  Recommended she calendar her pain, her cycles and her BM's  She is on OCP's, will take her pill at the same time daily  We discussed setting her up for a pelvic ultrasound if her pain isn't helped with management of her IBS  Vaginitis panel sent, a vaginitis could be causing burning pain with exam and intercourse, no signs of vulvodynia on exam  If she has persistent dyspareunia, she should return for further evaluation   CC: Deliah Boston, PA Note sent

## 2017-01-04 NOTE — Addendum Note (Signed)
Addended by: Shelda Jakes E on: 01/04/2017 02:13 PM   Modules accepted: Orders

## 2017-01-05 LAB — VAGINITIS/VAGINOSIS, DNA PROBE
Candida Species: NEGATIVE
Gardnerella vaginalis: NEGATIVE
Trichomonas vaginosis: NEGATIVE

## 2017-01-06 ENCOUNTER — Telehealth: Payer: Self-pay

## 2017-01-06 DIAGNOSIS — N871 Moderate cervical dysplasia: Secondary | ICD-10-CM

## 2017-01-06 DIAGNOSIS — N870 Mild cervical dysplasia: Secondary | ICD-10-CM

## 2017-01-06 NOTE — Telephone Encounter (Signed)
Placed call to patient to review benefits for recommended LEEP procedure. Left voicemail message requesting a return call.   cc: Nolen Mu, RN

## 2017-01-06 NOTE — Telephone Encounter (Signed)
Placed call to patient to advise the appointment she and I scheduled on 01/13/17 at 2:00 had to be canceled, due to a scheduling error. We will need to reschedule. Left voicemail message a return call

## 2017-01-06 NOTE — Telephone Encounter (Signed)
Patient returned call. Appointment has been rescheduled for same date, 01/13/17, but earlier in the day, with Dr Oscar La. Patient is aware of rescheduled appointment date, arrival time and cancellation policy.  Call transferred to [facility] to address questions regarding the procedure.  Routing to [employer], Charity fundraiser

## 2017-01-06 NOTE — Telephone Encounter (Signed)
-----   Message from Romualdo Bolk, MD sent at 01/05/2017  6:39 PM EDT ----- Please inform the patient that her vaginitis panel was negative. 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 Because her ECC is +, I would recommend treatment with leep (as opposed to close f/u).  Please explain and set her up for an appointment.

## 2017-01-06 NOTE — Telephone Encounter (Signed)
Left message to call Kaitlyn at [phone]. 

## 2017-01-06 NOTE — Telephone Encounter (Signed)
Spoke with patient. Advised of results as seen below from Dr.Jertson. All questions answered. Patient verbalizes understanding. Patient would like to have benefit information prior to scheduling. Order placed.   Routing to [employer]

## 2017-01-06 NOTE — Telephone Encounter (Signed)
Patient returned call. Reviewed benefits for a LEEP procedure. Patient understood information presented. Patient unable to continue the conversation, due to being at work. Patient states she will call back to schedule.    cc: Nolen Mu, RN

## 2017-01-06 NOTE — Telephone Encounter (Signed)
Patient returned call. Patient is scheduled for LEEP procedure on 01/13/17 with Dr Oscar La. Patient is aware of appointment date, arrival time and cancellation policy.     Patient requested to speak with a nurse to ask additional questions regarding procedure.  Routing to Triage

## 2017-01-06 NOTE — Telephone Encounter (Signed)
Spoke with patient. Patient would like to know is she can work the day of her LEEP procedure. 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 Advised will need to take it easy the first 24 hours after LEEP. May resume normal daily activities the next day. Will need to take 800 mg of ibuprofen or motrin 1 hours before procedure. Recommended that she bring someone to drive her home from the procedure if needed. Patient verbalizes understanding. Thomasene Lot previously rescheduled patient to 01/13/2017 at 9 am with Dr.Jertson.  Routing to provider for final review. Patient agreeable to disposition. Will close encounter.

## 2017-01-08 NOTE — Progress Notes (Signed)
Thank you for your care of this kind patient.  Deliah Boston, MS, PA-C 8:22 AM, 01/08/2017

## 2017-01-13 ENCOUNTER — Encounter: Payer: Self-pay | Admitting: Obstetrics and Gynecology

## 2017-01-13 ENCOUNTER — Ambulatory Visit (INDEPENDENT_AMBULATORY_CARE_PROVIDER_SITE_OTHER): Payer: Federal, State, Local not specified - PPO | Admitting: Obstetrics and Gynecology

## 2017-01-13 ENCOUNTER — Ambulatory Visit: Payer: Federal, State, Local not specified - PPO | Admitting: Obstetrics and Gynecology

## 2017-01-13 VITALS — BP 98/60 | HR 76 | Resp 12 | Wt 105.0 lb

## 2017-01-13 DIAGNOSIS — Z01812 Encounter for preprocedural laboratory examination: Secondary | ICD-10-CM | POA: Diagnosis not present

## 2017-01-13 DIAGNOSIS — N870 Mild cervical dysplasia: Secondary | ICD-10-CM

## 2017-01-13 DIAGNOSIS — N871 Moderate cervical dysplasia: Secondary | ICD-10-CM | POA: Diagnosis not present

## 2017-01-13 LAB — POCT URINE PREGNANCY: Preg Test, Ur: NEGATIVE

## 2017-01-13 NOTE — Progress Notes (Signed)
GYNECOLOGY  VISIT   HPI: 21 y.o.   Single  Caucasian  female   G0P0000 with Patient's last menstrual period was 01/11/2017.   here for LEEP. Patient with ASC-H pap. 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   GYNECOLOGIC HISTORY: Patient's last menstrual period was 01/11/2017. Contraception:OCP Menopausal hormone therapy: none         OB History    Gravida Para Term Preterm AB Living   0 0 0 0 0 0   SAB TAB Ectopic Multiple Live Births   0 0 0 0 0         There are no active problems to display for this patient.   Past Medical History:  Diagnosis Date  . Allergy   . Anxiety   . Depression   . Heart murmur   . IBS (irritable bowel syndrome)     Past Surgical History:  Procedure Laterality Date  . WRIST SURGERY Left 2014 or 2015    Current Outpatient Prescriptions  Medication Sig Dispense Refill  . cetirizine (ZYRTEC) 10 MG tablet Take 10 mg by mouth daily.    Marland Kitchen dicyclomine (BENTYL) 20 MG tablet Take 1 tablet (20 mg total) by mouth 3 (three) times daily as needed. 270 tablet 3  . famotidine (PEPCID) 20 MG tablet Take 1 tablet (20 mg total) by mouth at bedtime. Take nightly 30 tablet 1  . fluticasone (FLONASE) 50 MCG/ACT nasal spray Place 2 sprays into both nostrils daily. 16 g 11  . LORYNA 3-0.02 MG tablet Take 1 tablet by mouth daily. 84 tablet 3  . pantoprazole (PROTONIX) 40 MG tablet Take 1 tablet (40 mg total) by mouth daily. 30 tablet 3  . traMADol (ULTRAM) 50 MG tablet Take 1 tablet (50 mg total) by mouth every 8 (eight) hours as needed. 30 tablet 0   No current facility-administered medications for this visit.      ALLERGIES: Bee venom  Family History  Problem Relation Age of Onset  . Diabetes Mother   . Hyperlipidemia Mother   . Mental illness Mother   . Cancer Father   . Hyperlipidemia Father   . Mental illness Sister   . Mental illness Sister     Social History   Social History  . Marital status: Single    Spouse name: N/A  . Number of children:  N/A  . Years of education: N/A   Occupational History  . Not on file.   Social History Main Topics  . Smoking status: Light Tobacco Smoker    Types: Cigarettes  . Smokeless tobacco: Never Used  . Alcohol use 2.4 - 3.6 oz/week    4 - 6 Standard drinks or equivalent per week  . Drug use: No  . Sexual activity: Yes    Partners: Male    Birth control/ protection: Pill   Other Topics Concern  . Not on file   Social History Narrative  . No narrative on file    Review of Systems  Constitutional: Negative.   HENT: Negative.   Eyes: Negative.   Respiratory: Negative.   Cardiovascular: Negative.   Gastrointestinal: Negative.   Genitourinary: Negative.   Musculoskeletal: Negative.   Skin: Negative.   Neurological: Negative.   Endo/Heme/Allergies: Negative.   Psychiatric/Behavioral: Negative.     PHYSICAL EXAMINATION:    BP 98/60 (BP Location: Right Arm, Patient Position: Sitting, Cuff Size: Normal)   Pulse 76   Resp 12   Wt 105 lb (47.6 kg)   LMP  01/11/2017   BMI 19.20 kg/m     General appearance: alert, cooperative and appears stated age  Pelvic: External genitalia:  no lesions              Urethra:  normal appearing urethra with no masses, tenderness or lesions              Bartholins and Skenes: normal                 Vagina: normal appearing vagina with normal color and discharge, no lesions              Cervix: no gross lesions  Procedure: The patient was counseled as to the risks of the procedure, including: infection, bleeding, future pregnancy risks and cervical stenosis. A consent form was signed.  Under colposcopic guidance, acetic acid and then Lugols solution was placed on the cervix and a paracervical block was injected using 1% lidocaine with epinephrine, approximately 8 cc was used. Under colposcopic guidance, the 2 x 0.8 cm loop was used to remove 2 portions of the ectocervix taking care to get the entire transformation zone. The second specimen was  small and not oriented.   A second 1 x 1 cm loop was used to remove a portion of the endocervix. The settings were 55 cut, 50 coag with a blend of 1.  An ECC was performed. The cautery ball was then used to cauterize the base of the biopsy site and monsels were placed. The patient tolerated the procedure well.   Chaperone was present for exam.  ASSESSMENT CIN II with +ECC    PLAN Loop cone with ECC   An After Visit Summary was printed and given to the patient.

## 2017-01-13 NOTE — Patient Instructions (Signed)

## 2017-01-19 ENCOUNTER — Telehealth: Payer: Self-pay

## 2017-01-19 NOTE — Telephone Encounter (Signed)
Left message to call Amanda Gutierrez at [phone]. 

## 2017-01-19 NOTE — Telephone Encounter (Signed)
-----   Message from Jill Evelyn Jertson, MD sent at 01/19/2017 10:45 AM EDT ----- Please let the patient know that her pathology returned with CIN I and II, deep margin and ECC were negative for dysplasia. The ectocervical margin was + for CIN I, but this was expected because it was removed in 2 pieces and one couldn't be oriented. I also burned outside the area of the leep. I believe all of the dysplasia is gone.  F/U in one month, pap with hpv inn 1 year. 

## 2017-01-28 NOTE — Telephone Encounter (Signed)
-----   Message from Romualdo Bolk, MD sent at 01/19/2017 10:45 AM EDT ----- Please let the patient know that her pathology returned with CIN I and II, deep margin and ECC were negative for dysplasia. The ectocervical margin was + for CIN I, but this was expected because it was removed in 2 pieces and one couldn't be oriented. I also burned outside the area of the leep. I believe all of the dysplasia is gone.  F/U in one month, pap with hpv inn 1 year.

## 2017-01-28 NOTE — Telephone Encounter (Signed)
Call to patient. Left message to call back, Ask for triage nurse.

## 2017-02-02 NOTE — Telephone Encounter (Signed)
Spoke with patient. Advised of results as seen below from Dr.Jertson. Patient verbalizes understanding. 1 month follow up scheduled for 02/17/2017 at 11:30 am with Dr.Jertson. Patient is agreeable to date and time. 08 recall placed. Patient is due to start her menses soon and would like to know if she may use tampons now that she is 2 weeks out from her LEEP. Denies any vaginal bleeding at this time. Advised will review with Dr.Jertson and return call. Patient is agreeable.

## 2017-02-02 NOTE — Telephone Encounter (Signed)
I would have her wait to use tampons until after her f/u visit.

## 2017-02-03 NOTE — Telephone Encounter (Signed)
Left detailed message at number provided [phone], okay per ROI. Advised of message as seen below from Dr.Jertsona and advised to return call with any additional questions. Encounter closed.

## 2017-02-17 ENCOUNTER — Ambulatory Visit: Payer: Federal, State, Local not specified - PPO | Admitting: Obstetrics and Gynecology

## 2017-02-22 ENCOUNTER — Encounter: Payer: Self-pay | Admitting: Obstetrics and Gynecology

## 2017-02-22 ENCOUNTER — Ambulatory Visit: Payer: Federal, State, Local not specified - PPO | Admitting: Obstetrics and Gynecology

## 2017-02-22 VITALS — BP 94/52 | HR 76 | Resp 12 | Wt 105.0 lb

## 2017-02-22 DIAGNOSIS — N941 Unspecified dyspareunia: Secondary | ICD-10-CM | POA: Diagnosis not present

## 2017-02-22 DIAGNOSIS — N898 Other specified noninflammatory disorders of vagina: Secondary | ICD-10-CM

## 2017-02-22 DIAGNOSIS — Z9889 Other specified postprocedural states: Secondary | ICD-10-CM | POA: Diagnosis not present

## 2017-02-22 NOTE — Progress Notes (Signed)
GYNECOLOGY  VISIT   HPI: 21 y.o.   Single  Caucasian  female   G0P0000 with Patient's last menstrual period was 02/06/2017.   here for f/u s/p loop cone. Pathology with CIN I and II, deep margin and ECC were negative for dysplasia. The ectocervical margin was + for CIN I, but it was removed in 2 pieces, and burned beyond the specimen. No complaints, her bleeding/discharge stopped. No pain. She does notice an increase in creamy white vaginal d/c. No baseline discomfort.  She does have issues with pain with sex, on entry and feels dry.   GYNECOLOGIC HISTORY: Patient's last menstrual period was 02/06/2017. Contraception:OCP Menopausal hormone therapy: never        OB History    Gravida Para Term Preterm AB Living   0 0 0 0 0 0   SAB TAB Ectopic Multiple Live Births   0 0 0 0 0         There are no active problems to display for this patient.   Past Medical History:  Diagnosis Date  . Allergy   . Anxiety   . Depression   . Heart murmur   . IBS (irritable bowel syndrome)     Past Surgical History:  Procedure Laterality Date  . WRIST SURGERY Left 2014 or 2015    Current Outpatient Medications  Medication Sig Dispense Refill  . cetirizine (ZYRTEC) 10 MG tablet Take 10 mg by mouth daily.    Marland Kitchen dicyclomine (BENTYL) 20 MG tablet Take 1 tablet (20 mg total) by mouth 3 (three) times daily as needed. 270 tablet 3  . fluticasone (FLONASE) 50 MCG/ACT nasal spray Place 2 sprays into both nostrils daily. 16 g 11  . LORYNA 3-0.02 MG tablet Take 1 tablet by mouth daily. 84 tablet 3  . traMADol (ULTRAM) 50 MG tablet Take 1 tablet (50 mg total) by mouth every 8 (eight) hours as needed. 30 tablet 0   No current facility-administered medications for this visit.      ALLERGIES: Bee venom  Family History  Problem Relation Age of Onset  . Diabetes Mother   . Hyperlipidemia Mother   . Mental illness Mother   . Cancer Father   . Hyperlipidemia Father   . Mental illness Sister   .  Mental illness Sister     Social History   Socioeconomic History  . Marital status: Single    Spouse name: Not on file  . Number of children: Not on file  . Years of education: Not on file  . Highest education level: Not on file  Social Needs  . Financial resource strain: Not on file  . Food insecurity - worry: Not on file  . Food insecurity - inability: Not on file  . Transportation needs - medical: Not on file  . Transportation needs - non-medical: Not on file  Occupational History  . Not on file  Tobacco Use  . Smoking status: Light Tobacco Smoker    Types: Cigarettes  . Smokeless tobacco: Never Used  Substance and Sexual Activity  . Alcohol use: Yes    Alcohol/week: 2.4 - 3.6 oz    Types: 4 - 6 Standard drinks or equivalent per week  . Drug use: No  . Sexual activity: Yes    Partners: Male    Birth control/protection: Pill  Other Topics Concern  . Not on file  Social History Narrative  . Not on file    Review of Systems  All other systems reviewed  and are negative.   PHYSICAL EXAMINATION:    BP (!) 94/52 (BP Location: Right Arm, Patient Position: Sitting, Cuff Size: Normal)   Pulse 76   Resp 12   Wt 105 lb (47.6 kg)   LMP 02/06/2017   BMI 19.20 kg/m     General appearance: alert, cooperative and appears stated age  Pelvic: External genitalia:  no lesions              Urethra:  normal appearing urethra with no masses, tenderness or lesions              Bartholins and Skenes: normal                 Vagina:tender with insertion of the speculum, normal appearing vagina. Slight increase in watery white vagina d/c              Cervix: no lesions and post leep appearance              Bimanual Exam:  Uterus:  normal size, contour, position, consistency, mobility, non-tender              Adnexa: no mass, fullness, tenderness               Chaperone was present for exam.  ASSESSMENT F/u s/p Leep, healing well, ectocervical margin + for CIN I but it was removed  in 2 specimens, edge cauterized Vaginal discharge, vaginal burning with use of the speculum Some entry dyspareunia    PLAN F/U pap with hpv at her next annual exam, next year with her primary Affirm sent Will r/o vaginitis, use a lubricant with intercourse, call if still problematic    An After Visit Summary was printed and given to the patient.  CC: Deliah BostonMichael Clark, PA

## 2017-02-22 NOTE — Progress Notes (Signed)
Thank you for you care Dr. Oscar LaJertson.  Hi Sherese,  Please give Ms. Shimp a call and advise of follow up per Dr. Salli QuarryJertson's recommendations.  I like us to try and schedule those appointments if possible.   Deliah BostonMichael Zavien Clubb, MS, PA-C 12:38 PM, 02/22/2017

## 2017-02-23 ENCOUNTER — Telehealth: Payer: Self-pay | Admitting: *Deleted

## 2017-02-23 LAB — VAGINITIS/VAGINOSIS, DNA PROBE
Candida Species: NEGATIVE
Gardnerella vaginalis: NEGATIVE
Trichomonas vaginosis: NEGATIVE

## 2017-02-23 NOTE — Telephone Encounter (Signed)
-----   Message from Romualdo BolkJill Evelyn Jertson, MD sent at 02/23/2017  9:46 AM EST ----- Please advise the patient of normal results. If she uses lubricant with intercourse and is still having discomfort, she should come in for further evaluation.

## 2017-02-23 NOTE — Telephone Encounter (Signed)
Left message to call regarding results -eh 

## 2017-02-26 NOTE — Telephone Encounter (Signed)
Spoke with patient, advised as seen below per Dr. Jertson. Patient verbalizes understanding and is agreeable. Will close encounter.  

## 2017-03-16 ENCOUNTER — Telehealth: Payer: Self-pay | Admitting: Obstetrics and Gynecology

## 2017-03-16 DIAGNOSIS — R102 Pelvic and perineal pain: Secondary | ICD-10-CM

## 2017-03-16 NOTE — Telephone Encounter (Signed)
Patient says she is still having pain after appointment with the GI doctor. Patient would like to follow up with Dr,Jertson.

## 2017-03-16 NOTE — Telephone Encounter (Signed)
Spoke with patient. Patient states she has f/u with GI as recommended, was advised no concerns. Patient reports ongoing pelvic pain, was advised to call if problem continues.   Affirm negative on 02/22/17. Has not attempted intercourse since last OV, has not tried lubricant.   Patient scheduled for PUS on 03/23/17 at 2pm, consult to follow at 2:30pm  with Dr. Oscar LaJertson. Advised patient will review with Dr. Oscar LaJertson and return call with any additional recommendations, patient is agreeable.   Dr. Oscar LaJertson, ok to proceed with PUS as scheduled?   Cc: Harland DingwallSuzy Dixon

## 2017-03-17 NOTE — Telephone Encounter (Signed)
Yes, I would recommend ultrasound.  If she is sexually active, she should use a lubricant.

## 2017-03-18 NOTE — Telephone Encounter (Signed)
Left detailed message, ok per current dpr. Advised as seen below per Dr. Oscar LaJertson. Advised to return call to office with any additional questions/concerns.   Order placed for PUS.  Routing to provider for final review. Patient is agreeable to disposition. Will close encounter.  Cc: Harland DingwallSuzy Dixon

## 2017-03-23 ENCOUNTER — Ambulatory Visit (INDEPENDENT_AMBULATORY_CARE_PROVIDER_SITE_OTHER): Payer: Federal, State, Local not specified - PPO | Admitting: Obstetrics and Gynecology

## 2017-03-23 ENCOUNTER — Telehealth: Payer: Self-pay

## 2017-03-23 ENCOUNTER — Encounter: Payer: Self-pay | Admitting: Obstetrics and Gynecology

## 2017-03-23 ENCOUNTER — Other Ambulatory Visit: Payer: Self-pay

## 2017-03-23 ENCOUNTER — Ambulatory Visit (INDEPENDENT_AMBULATORY_CARE_PROVIDER_SITE_OTHER): Payer: Federal, State, Local not specified - PPO

## 2017-03-23 VITALS — BP 110/70 | HR 84 | Resp 14 | Wt 105.0 lb

## 2017-03-23 DIAGNOSIS — R102 Pelvic and perineal pain: Secondary | ICD-10-CM | POA: Diagnosis not present

## 2017-03-23 DIAGNOSIS — R109 Unspecified abdominal pain: Secondary | ICD-10-CM | POA: Diagnosis not present

## 2017-03-23 DIAGNOSIS — K580 Irritable bowel syndrome with diarrhea: Secondary | ICD-10-CM | POA: Diagnosis not present

## 2017-03-23 DIAGNOSIS — R140 Abdominal distension (gaseous): Secondary | ICD-10-CM

## 2017-03-23 DIAGNOSIS — M6289 Other specified disorders of muscle: Secondary | ICD-10-CM

## 2017-03-23 DIAGNOSIS — N941 Unspecified dyspareunia: Secondary | ICD-10-CM

## 2017-03-23 NOTE — Addendum Note (Signed)
Addended by: Shelda JakesHANNER, MARTHA E on: 03/23/2017 04:19 PM   Modules accepted: Orders

## 2017-03-23 NOTE — Progress Notes (Signed)
GYNECOLOGY  VISIT   HPI: 21 y.o.   Single  Caucasian  female   G0P0000 with Patient's last menstrual period was 03/11/2017.   here for follow up pelvic pain. The patient has a several month h/o intermittent BLQ abdominal/pelvic pain. She has 2 different types of pain. One of the pains feel throbbing or squeezing pain. The typical IBS pain is sharp. She thought the pain may be cyclic, but now she isn't sure. She saw the GI MD for her IBS, he said she could have pain from the IBS. She has normal BM's to diarrhea. She takes bentyl, she does feel better if she passes gas.  Her anxiety makes her IBS worse.  She is on OCP's, cyclically. Cycles monthly x 3-4 days. Saturates a super tampon in 6+ hours. No BTB. Tolerable cramps. IBS gets worse with her cycle. She does c/o a one week h/o increased vaginal d/c. No itching, burning or irriation. Off and on odor from the vagina.    GYNECOLOGIC HISTORY: Patient's last menstrual period was 03/11/2017. Contraception:OCP Menopausal hormone therapy: none         OB History    Gravida Para Term Preterm AB Living   0 0 0 0 0 0   SAB TAB Ectopic Multiple Live Births   0 0 0 0 0         There are no active problems to display for this patient.   Past Medical History:  Diagnosis Date  . Allergy   . Anxiety   . Depression   . Heart murmur   . IBS (irritable bowel syndrome)     Past Surgical History:  Procedure Laterality Date  . WRIST SURGERY Left 2014 or 2015    Current Outpatient Medications  Medication Sig Dispense Refill  . cetirizine (ZYRTEC) 10 MG tablet Take 10 mg by mouth daily.    Marland Kitchen. dicyclomine (BENTYL) 20 MG tablet Take 1 tablet (20 mg total) by mouth 3 (three) times daily as needed. 270 tablet 3  . fluticasone (FLONASE) 50 MCG/ACT nasal spray Place 2 sprays into both nostrils daily. 16 g 11  . LORYNA 3-0.02 MG tablet Take 1 tablet by mouth daily. 84 tablet 3  . traMADol (ULTRAM) 50 MG tablet Take 1 tablet (50 mg total) by mouth  every 8 (eight) hours as needed. 30 tablet 0   No current facility-administered medications for this visit.      ALLERGIES: Bee venom  Family History  Problem Relation Age of Onset  . Diabetes Mother   . Hyperlipidemia Mother   . Mental illness Mother   . Cancer Father   . Hyperlipidemia Father   . Mental illness Sister   . Mental illness Sister     Social History   Socioeconomic History  . Marital status: Single    Spouse name: Not on file  . Number of children: Not on file  . Years of education: Not on file  . Highest education level: Not on file  Social Needs  . Financial resource strain: Not on file  . Food insecurity - worry: Not on file  . Food insecurity - inability: Not on file  . Transportation needs - medical: Not on file  . Transportation needs - non-medical: Not on file  Occupational History  . Not on file  Tobacco Use  . Smoking status: Light Tobacco Smoker    Types: Cigarettes  . Smokeless tobacco: Never Used  Substance and Sexual Activity  . Alcohol use:  Yes    Alcohol/week: 2.4 - 3.6 oz    Types: 4 - 6 Standard drinks or equivalent per week  . Drug use: No  . Sexual activity: Yes    Partners: Male    Birth control/protection: Pill  Other Topics Concern  . Not on file  Social History Narrative  . Not on file    Review of Systems  Constitutional: Negative.   HENT: Negative.   Eyes: Negative.   Respiratory: Negative.   Cardiovascular: Negative.   Gastrointestinal:       Bloating  Genitourinary:       Pelvic pain Pain with intercourse  Loss of sexual interest   Musculoskeletal: Negative.   Skin: Negative.   Neurological: Negative.   Endo/Heme/Allergies: Negative.   Psychiatric/Behavioral: Negative.     PHYSICAL EXAMINATION:    BP 110/70 (BP Location: Right Arm, Patient Position: Sitting, Cuff Size: Normal)   Pulse 84   Resp 14   Wt 105 lb (47.6 kg)   LMP 03/11/2017   BMI 19.20 kg/m     General appearance: alert, cooperative  and appears stated age Abdomen: soft, mildly tender in the area of the ascending and descending colon, mildly distended.  No masses,  no organomegaly  Pelvic: External genitalia:  no lesions. No point tenderness at the vestibule.               Urethra:  normal appearing urethra with no masses, tenderness or lesions              Bartholins and Skenes: normal                 Vagina: normal appearing vagina with normal color and discharge, no lesions              Cervix: no cervical motion tenderness and no lesions              Bimanual Exam:  Uterus:  normal size, contour, position, consistency, mobility, non-tender and anteverted              Adnexa: no mass, fullness, tenderness   Pelvic floor: tight and tender on the left, not on the right               Chaperone was present for exam.  Ultrasound images reviewed with the patient  ASSESSMENT Abdominal/pelvic pain, normal GYN ultrasound IBS with diarrhea Pelvic floor tightness and tenderness on the left Dyspareunia    PLAN She would like referral for a second opinion with a different GI Will refer to Pelvic floor PT F/U in 6 weeks We discussed the option of a 3 month pill, her IBS does worsen with her cycle (she will call if she wants to try it)   An After Visit Summary was printed and given to the patient.  25 minutes face to face time of which over 50% was spent in counseling.

## 2017-03-23 NOTE — Telephone Encounter (Signed)
Referral placed to Dr.Nandigam with [facility]. Referral's coordinator will help to get this set up for the patient.  Routing to provider for final review. Patient agreeable to disposition. Will close encounter.

## 2017-03-23 NOTE — Telephone Encounter (Signed)
-----   Message from Romualdo BolkJill Evelyn Jertson, MD sent at 03/23/2017  3:05 PM EST ----- Can you try to get the patient in to see a different GI MD. [facility] and didn't feel it was a good fit for her.  Thanks, Noreene LarssonJill

## 2017-03-25 LAB — VAGINITIS/VAGINOSIS, DNA PROBE
Candida Species: NEGATIVE
Gardnerella vaginalis: POSITIVE — AB
Trichomonas vaginosis: NEGATIVE

## 2017-03-26 ENCOUNTER — Telehealth: Payer: Self-pay | Admitting: *Deleted

## 2017-03-26 MED ORDER — METRONIDAZOLE 500 MG PO TABS: 500.0000 mg | ORAL_TABLET | Freq: Two times a day (BID) | ORAL | 0 refills | Status: AC

## 2017-03-26 NOTE — Telephone Encounter (Signed)
Spoke with patient and gave results and recommendations. Sent in order for Flagyl. Advised patient to avoid alcohol while taking Flagyl. Patient voiced understanding. -eh

## 2017-03-26 NOTE — Telephone Encounter (Signed)
-----   Message from Romualdo BolkJill Evelyn Jertson, MD sent at 03/25/2017  5:55 PM EST ----- Please inform the patient that her vaginitis probe was + for BV and treat with flagyl (either oral or vaginal, her choice), no ETOH while on Flagyl.  Oral: Flagyl 500 mg BID x 7 days, or Vaginal: Metrogel, 1 applicator per vagina q day x 5 days.

## 2017-04-21 ENCOUNTER — Telehealth: Payer: Self-pay

## 2017-04-21 NOTE — Telephone Encounter (Signed)
Called patient to follow up on GI Referral appointment and wanting to know if patient has scheduled appointment. Left message to return my call.

## 2017-05-05 ENCOUNTER — Telehealth: Payer: Self-pay | Admitting: Obstetrics and Gynecology

## 2017-05-05 NOTE — Telephone Encounter (Signed)
[facility] per referral note response from Dr Oscar LaJertson, referral for physical therapy has been closed, due to no response from the patient.    cc: Dr Oscar LaJertson

## 2017-05-05 NOTE — Telephone Encounter (Signed)
[place] from [facility] is calling to speak with our referral coordinator. [phone]

## 2017-08-18 ENCOUNTER — Ambulatory Visit: Payer: Federal, State, Local not specified - PPO | Admitting: Obstetrics and Gynecology

## 2017-08-27 ENCOUNTER — Ambulatory Visit: Payer: Federal, State, Local not specified - PPO | Admitting: Obstetrics and Gynecology

## 2017-09-08 ENCOUNTER — Encounter: Payer: Self-pay | Admitting: Certified Nurse Midwife

## 2017-09-08 ENCOUNTER — Ambulatory Visit: Payer: Federal, State, Local not specified - PPO | Admitting: Certified Nurse Midwife

## 2017-09-08 NOTE — Progress Notes (Deleted)
22 y.o. G0P0000 Single  {Race/ethnicity:17218} Fe here for annual exam.    No LMP recorded.          Sexually active: {yes no:314532}  The current method of family planning is {contraception:315051}.    Exercising: {yes no:314532}  {types:19826} Smoker:  {YES NO:22349}  Health Maintenance: Pap:  12-15-16 ASCUS cant exclude high grade History of Abnormal Pap: yes CINII MMG:  none Self Breast exams: {YES NO:22349} Colonoscopy:  none BMD:   none TDaP:  2017 Shingles: no Pneumonia: no Hep C and HIV: *** Labs: ***   reports that she has been smoking cigarettes.  She has never used smokeless tobacco. She reports that she drinks about 2.4 - 3.6 oz of alcohol per week. She reports that she does not use drugs.  Past Medical History:  Diagnosis Date  . Allergy   . Anxiety   . Depression   . Heart murmur   . IBS (irritable bowel syndrome)     Past Surgical History:  Procedure Laterality Date  . WRIST SURGERY Left 2014 or 2015    Current Outpatient Medications  Medication Sig Dispense Refill  . cetirizine (ZYRTEC) 10 MG tablet Take 10 mg by mouth daily.    Marland Kitchen dicyclomine (BENTYL) 20 MG tablet Take 1 tablet (20 mg total) by mouth 3 (three) times daily as needed. 270 tablet 3  . fluticasone (FLONASE) 50 MCG/ACT nasal spray Place 2 sprays into both nostrils daily. 16 g 11  . LORYNA 3-0.02 MG tablet Take 1 tablet by mouth daily. 84 tablet 3  . traMADol (ULTRAM) 50 MG tablet Take 1 tablet (50 mg total) by mouth every 8 (eight) hours as needed. 30 tablet 0   No current facility-administered medications for this visit.     Family History  Problem Relation Age of Onset  . Diabetes Mother   . Hyperlipidemia Mother   . Mental illness Mother   . Cancer Father   . Hyperlipidemia Father   . Mental illness Sister   . Mental illness Sister     ROS:  Pertinent items are noted in HPI.  Otherwise, a comprehensive ROS was negative.  Exam:   There were no vitals taken for this visit.    Ht Readings from Last 3 Encounters:  01/04/17  (1.575 m)  12/11/16  (1.6 m)  12/09/16  (1.6 m)    General appearance: alert, cooperative and appears stated age Head: Normocephalic, without obvious abnormality, atraumatic Neck: no adenopathy, supple, symmetrical, trachea midline and thyroid {EXAM; THYROID:18604} Lungs: clear to auscultation bilaterally Breasts: {Exam; breast:13139::"normal appearance, no masses or tenderness"} Heart: regular rate and rhythm Abdomen: soft, non-tender; no masses,  no organomegaly Extremities: extremities normal, atraumatic, no cyanosis or edema Skin: Skin color, texture, turgor normal. No rashes or lesions Lymph nodes: Cervical, supraclavicular, and axillary nodes normal. No abnormal inguinal nodes palpated Neurologic: Grossly normal   Pelvic: External genitalia:  no lesions              Urethra:  normal appearing urethra with no masses, tenderness or lesions              Bartholin's and Skene's: normal                 Vagina: normal appearing vagina with normal color and discharge, no lesions              Cervix: {exam; cervix:14595}  Pap taken: {yes no:314532} Bimanual Exam:  Uterus:  {exam; uterus:12215}              Adnexa: {exam; adnexa:12223}               Rectovaginal: Confirms               Anus:  normal sphincter tone, no lesions  Chaperone present: ***  A:  Well Woman with normal exam  P:   Reviewed health and wellness pertinent to exam  Pap smear: {YES NO:22349}  {plan; gyn:5269::"mammogram","pap smear","return annually or prn"}  An After Visit Summary was printed and given to the patient.

## 2017-10-22 ENCOUNTER — Ambulatory Visit: Payer: Federal, State, Local not specified - PPO | Admitting: Physician Assistant

## 2017-10-22 ENCOUNTER — Encounter: Payer: Self-pay | Admitting: Physician Assistant

## 2017-10-22 VITALS — BP 129/78 | HR 80 | Temp 98.5°F | Resp 17 | Ht 63.0 in | Wt 101.0 lb

## 2017-10-22 DIAGNOSIS — L219 Seborrheic dermatitis, unspecified: Secondary | ICD-10-CM | POA: Diagnosis not present

## 2017-10-22 DIAGNOSIS — L700 Acne vulgaris: Secondary | ICD-10-CM | POA: Diagnosis not present

## 2017-10-22 MED ORDER — DOXYCYCLINE HYCLATE 100 MG PO CAPS: 100.0000 mg | ORAL_CAPSULE | Freq: Two times a day (BID) | ORAL | 0 refills | Status: AC

## 2017-10-22 MED ORDER — TRETINOIN 0.025 % EX CREA: TOPICAL_CREAM | Freq: Every day | CUTANEOUS | 11 refills | Status: DC

## 2017-10-22 MED ORDER — DOXYCYCLINE HYCLATE 100 MG PO CAPS: 100.0000 mg | ORAL_CAPSULE | Freq: Two times a day (BID) | ORAL | 0 refills | Status: DC

## 2017-10-22 MED ORDER — CLOBETASOL PROPIONATE 0.05 % EX SHAM: 1.0000 "application " | MEDICATED_SHAMPOO | Freq: Every day | CUTANEOUS | 0 refills | Status: DC | PRN

## 2017-10-22 NOTE — Progress Notes (Signed)
10/23/2017 9:21 AM   DOB: [birth year] / MRN: [id number]  SUBJECTIVE:  Amanda Gutierrez is a 22 y.o. female presenting for chronic acne worse since she stopped her birth control. Symptoms present for years.  The problem is worse. She has tried a combination retinol benzyl peroxide prescription cream but states this dries her skin out too much.  Complains of itchy scalp with small "bumps" about the frontal hairline.  Sometimes these bumps are painful.  She does have a history of seborrheic dermatitis of the scalp.  She is allergic to bee venom.   She  has a past medical history of Abnormal Pap smear of cervix, Allergy, Anxiety, Depression, Heart murmur, and IBS (irritable bowel syndrome).    She  reports that she has been smoking cigarettes.  She has been smoking about 0.20 packs per day. She has never used smokeless tobacco. She reports that she drinks about 2.4 - 3.6 oz of alcohol per week. She reports that she does not use drugs. She  reports that she currently engages in sexual activity and has had partners who are Female. She reports using the following method of birth control/protection: Pill. The patient  has a past surgical history that includes Wrist surgery (Left, 2014 or 2015); LEEP; and Colposcopy.  Her family history includes Cancer in her father; Diabetes in her mother; Hyperlipidemia in her father and mother; Mental illness in her mother, sister, and sister.  Review of Systems  Constitutional: Negative for chills, diaphoresis and fever.  Eyes: Negative.   Respiratory: Negative for cough, hemoptysis, sputum production, shortness of breath and wheezing.   Cardiovascular: Negative for chest pain, orthopnea and leg swelling.  Gastrointestinal: Negative for nausea.  Skin: Negative for rash.  Neurological: Negative for dizziness, sensory change, speech change, focal weakness and headaches.    The problem list and medications were reviewed and updated by myself where necessary and exist  elsewhere in the encounter.   OBJECTIVE:  BP 129/78   Pulse 80   Temp 98.5 F (36.9 C) (Oral)   Resp 17   Ht 5\' 3"  (1.6 m)   Wt 101 lb (45.8 kg)   SpO2 98%   BMI 17.89 kg/m   Wt Readings from Last 3 Encounters:  10/22/17 101 lb (45.8 kg)  03/23/17 105 lb (47.6 kg)  02/22/17 105 lb (47.6 kg)   Temp Readings from Last 3 Encounters:  10/22/17 98.5 F (36.9 C) (Oral)  12/11/16 98 F (36.7 C) (Oral)  12/09/16 98 F (36.7 C) (Oral)   BP Readings from Last 3 Encounters:  10/22/17 129/78  03/23/17 110/70  02/22/17 (!) 94/52   Pulse Readings from Last 3 Encounters:  10/22/17 80  03/23/17 84  02/22/17 76    Physical Exam  Constitutional: She is oriented to person, place, and time. She appears well-nourished.  Non-toxic appearance. No distress.  HENT:  Head:    Eyes: Pupils are equal, round, and reactive to light. EOM are normal.  Cardiovascular: Normal rate, regular rhythm, S1 normal, S2 normal, normal heart sounds and intact distal pulses. Exam reveals no gallop, no friction rub and no decreased pulses.  No murmur heard. Pulmonary/Chest: Effort normal. No stridor. No respiratory distress. She has no wheezes. She has no rales.  Abdominal: She exhibits no distension.  Musculoskeletal: She exhibits no edema.  Neurological: She is alert and oriented to person, place, and time. No cranial nerve deficit. Gait normal.  Skin: Skin is warm and dry. She is not diaphoretic. No pallor.  Psychiatric: She has a normal mood and affect.  Vitals reviewed.   No results found for: HGBA1C  Lab Results  Component Value Date   WBC 5.2 12/09/2016   HGB 12.2 12/09/2016   HCT 36.9 12/09/2016   MCV 81 12/09/2016   PLT 233 12/09/2016    Lab Results  Component Value Date   CREATININE 0.72 12/09/2016   BUN 7 12/09/2016   NA 141 12/09/2016   K 4.0 12/09/2016   CL 103 12/09/2016   CO2 22 12/09/2016    Lab Results  Component Value Date   ALT 7 12/09/2016   AST 15 12/09/2016    ALKPHOS 38 (L) 12/09/2016   BILITOT <0.2 12/09/2016    Lab Results  Component Value Date   TSH 1.950 06/20/2016    No results found for: CHOL, HDL, LDLCALC, LDLDIRECT, TRIG, CHOLHDL   ASSESSMENT AND PLAN:  Michelle Nasutilena was seen today for acne.  Diagnoses and all orders for this visit:  Seborrheic dermatitis of scalp -     Clobetasol Propionate (CLOBEX) 0.05 % shampoo; Apply 1 application topically daily as needed.  Acne vulgaris -     tretinoin (RETIN-A) 0.025 % cream; Apply topically at bedtime. -     doxycycline (VIBRAMYCIN) 100 MG capsule; Take 1 capsule (100 mg total) by mouth 2 (two) times daily for 10 days. Okay to start for acne flare after negative pregnancy test.  Other orders -     Discontinue: doxycycline (VIBRAMYCIN) 100 MG capsule; Take 1 capsule (100 mg total) by mouth 2 (two) times daily for 10 days. Okay to start for acne flare after negative pregnancy test.    The patient is advised to call or return to clinic if she does not see an improvement in symptoms, or to seek the care of the closest emergency department if she worsens with the above plan.   Deliah BostonMichael Clark, MHS, PA-C [facility] 10/23/2017 9:21 AM

## 2017-10-22 NOTE — Patient Instructions (Addendum)
Start the new cream daily.  It is low potency so hopefully you will not have any excessive drying.  Use the doxycycline only if you have a bad outbreak.  The new shampoo will help your scalp calm down.     IF you received an x-ray today, you will receive an invoice from [facility]. Please contact [facility] at [phone] with questions or concerns regarding your invoice.   IF you received labwork today, you will receive an invoice from [facility]. Please contact [facility] at [phone] with questions or concerns regarding your invoice.   Our billing staff will not be able to assist you with questions regarding bills from these companies.  You will be contacted with the lab results as soon as they are available. The fastest way to get your results is to activate your My Chart account. Instructions are located on the last page of this paperwork. If you have not heard from us regarding the results in 2 weeks, please contact this office.

## 2017-11-10 ENCOUNTER — Other Ambulatory Visit: Payer: Self-pay

## 2017-11-10 ENCOUNTER — Encounter: Payer: Self-pay | Admitting: Certified Nurse Midwife

## 2017-11-10 ENCOUNTER — Ambulatory Visit: Payer: Federal, State, Local not specified - PPO | Admitting: Certified Nurse Midwife

## 2017-11-10 VITALS — BP 100/60 | HR 68 | Resp 16 | Ht 62.25 in | Wt 103.0 lb

## 2017-11-10 DIAGNOSIS — Z113 Encounter for screening for infections with a predominantly sexual mode of transmission: Secondary | ICD-10-CM | POA: Diagnosis not present

## 2017-11-10 DIAGNOSIS — N898 Other specified noninflammatory disorders of vagina: Secondary | ICD-10-CM | POA: Diagnosis not present

## 2017-11-10 DIAGNOSIS — Z3009 Encounter for other general counseling and advice on contraception: Secondary | ICD-10-CM | POA: Diagnosis not present

## 2017-11-10 NOTE — Progress Notes (Signed)
22 y.o. Single Caucasian female G0P0000 here with complaint of vaginal symptoms of  odor and increase discharge. Denies vaginal itching or burning. Describes discharge as watery.Onset of symptoms 14 days ago. Denies new personal products. No STD concerns, but has had partner change and would like STD screening.Marland Kitchen. Urinary symptoms none, denies burning or frequency. . Contraception is nothing at this point, previous OCP use, not sure if will restart. Not planning pregnancy. No other health concerns today.  Review of Systems  Constitutional: Negative for chills and fever.  Genitourinary: Negative for dysuria, frequency and urgency.       Vaginal odor is the main problem.    O:Healthy female WDWN Affect: normal, orientation x 3  Exam:Skin: warm and dry Abdomen:soft, non tender  Inguinal Lymph nodes: no enlargement or tenderness Pelvic exam: External genital: normal female, no scaling or exudate or lesions or tenderness BUS: negative Vagina: clear slight odorous discharge noted.   Affirm taken, Gc/chlamydia take Cervix: normal, non tender, no CMT Uterus: normal, non tender Adnexa:normal, non tender, no masses or fullness noted   A:Normal pelvic exam R/O vaginal and STD infection Contraception options     P:Discussed findings of normal pelvic exam.  Discussed Aveeno or baking soda sitz bath for comfort for symptoms at present until lab is back will treat if indicated. Lab: affirm  Avoid moist clothes  for extended period of time, which can increase irritation.. If working out in gym clothes or swim suits for long periods of time change underwear or bottoms of swimsuit if possible. Coconut Oil use for skin protection prior to activity can be used to external skin for protection. Discussed contraception options of risks/benefits/expectations of OCP and Nuvaring. Questions addressed. Patient only interested in these. Will advise at aex if she desires.  Rv prn, aex

## 2017-11-11 LAB — VAGINITIS/VAGINOSIS, DNA PROBE
Candida Species: NEGATIVE
Gardnerella vaginalis: NEGATIVE
Trichomonas vaginosis: NEGATIVE

## 2017-11-12 ENCOUNTER — Other Ambulatory Visit: Payer: Self-pay | Admitting: Certified Nurse Midwife

## 2017-11-12 DIAGNOSIS — Z30015 Encounter for initial prescription of vaginal ring hormonal contraceptive: Secondary | ICD-10-CM

## 2017-11-12 LAB — GC/CHLAMYDIA PROBE AMP
Chlamydia trachomatis, NAA: NEGATIVE
Neisseria gonorrhoeae by PCR: NEGATIVE

## 2017-11-12 MED ORDER — ETONOGESTREL-ETHINYL ESTRADIOL 0.12-0.015 MG/24HR VA RING: VAGINAL_RING | VAGINAL | 0 refills | Status: DC

## 2017-12-14 ENCOUNTER — Other Ambulatory Visit: Payer: Self-pay | Admitting: Certified Nurse Midwife

## 2017-12-14 DIAGNOSIS — Z30015 Encounter for initial prescription of vaginal ring hormonal contraceptive: Secondary | ICD-10-CM

## 2017-12-14 NOTE — Telephone Encounter (Signed)
When did she an aex

## 2017-12-14 NOTE — Telephone Encounter (Signed)
Medication refill request: Nuvaring Last Office Visit. 11/10/17 Next AEX:  Nothing scheduled at this time  Last MMG (if hormonal medication request): NA Refill authorized: Please advise.

## 2017-12-15 NOTE — Telephone Encounter (Signed)
Patient was given one Nuvaring and was to schedule aex in August before another refill given

## 2017-12-23 ENCOUNTER — Other Ambulatory Visit: Payer: Self-pay | Admitting: *Deleted

## 2017-12-23 NOTE — Telephone Encounter (Signed)
Detailed message left per DPR on mobile number advising patient refill request received from pharmacy for nuvaring. Advised patient to call and schedule aex per Leota Sauers, CNM for additional refills. Medication not authorized to pharmacy- patient needs appointment.

## 2018-02-04 IMAGING — CR DG CERVICAL SPINE 2 OR 3 VIEWS
3 series · 3 of 3 positions shown · non-contrast
Comparison: None.

CLINICAL DATA: Neck pain with bilateral hand paresthesia over the
last 2-3 months, no trauma

EXAM:
CERVICAL SPINE - 2-3 VIEW

[w cervical spine lat]
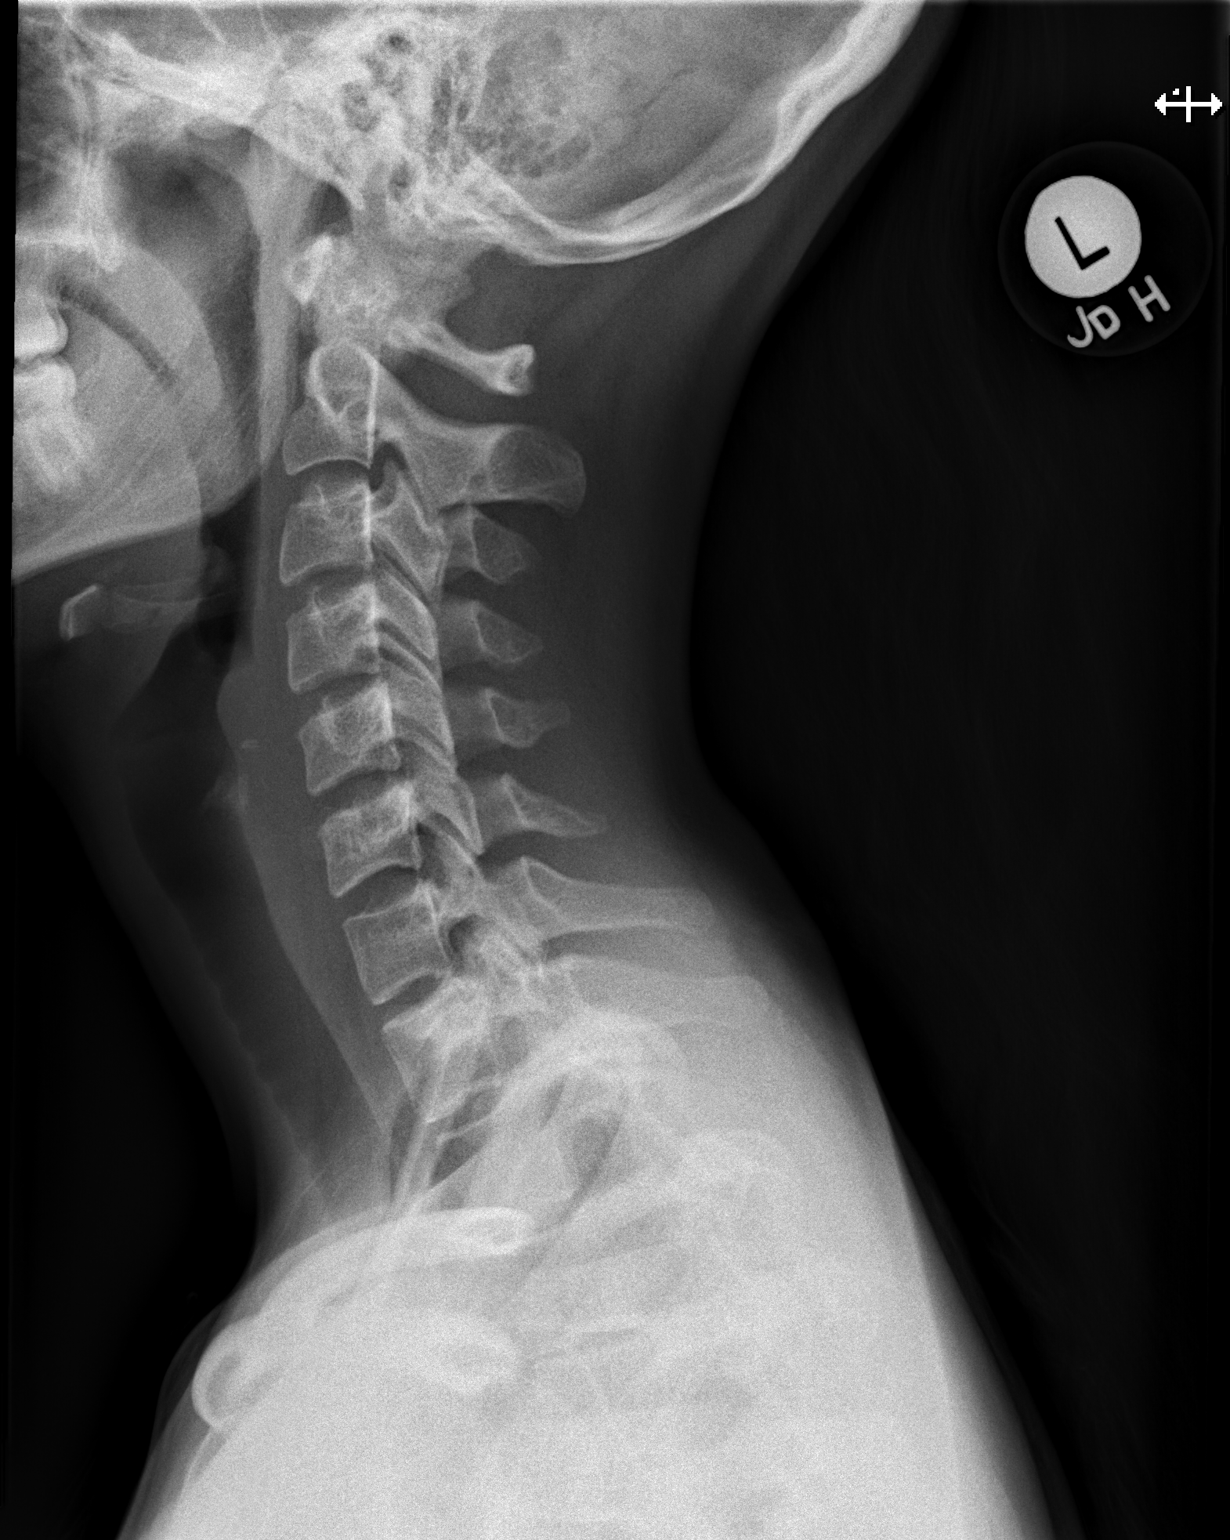

[w cervical spine ap]
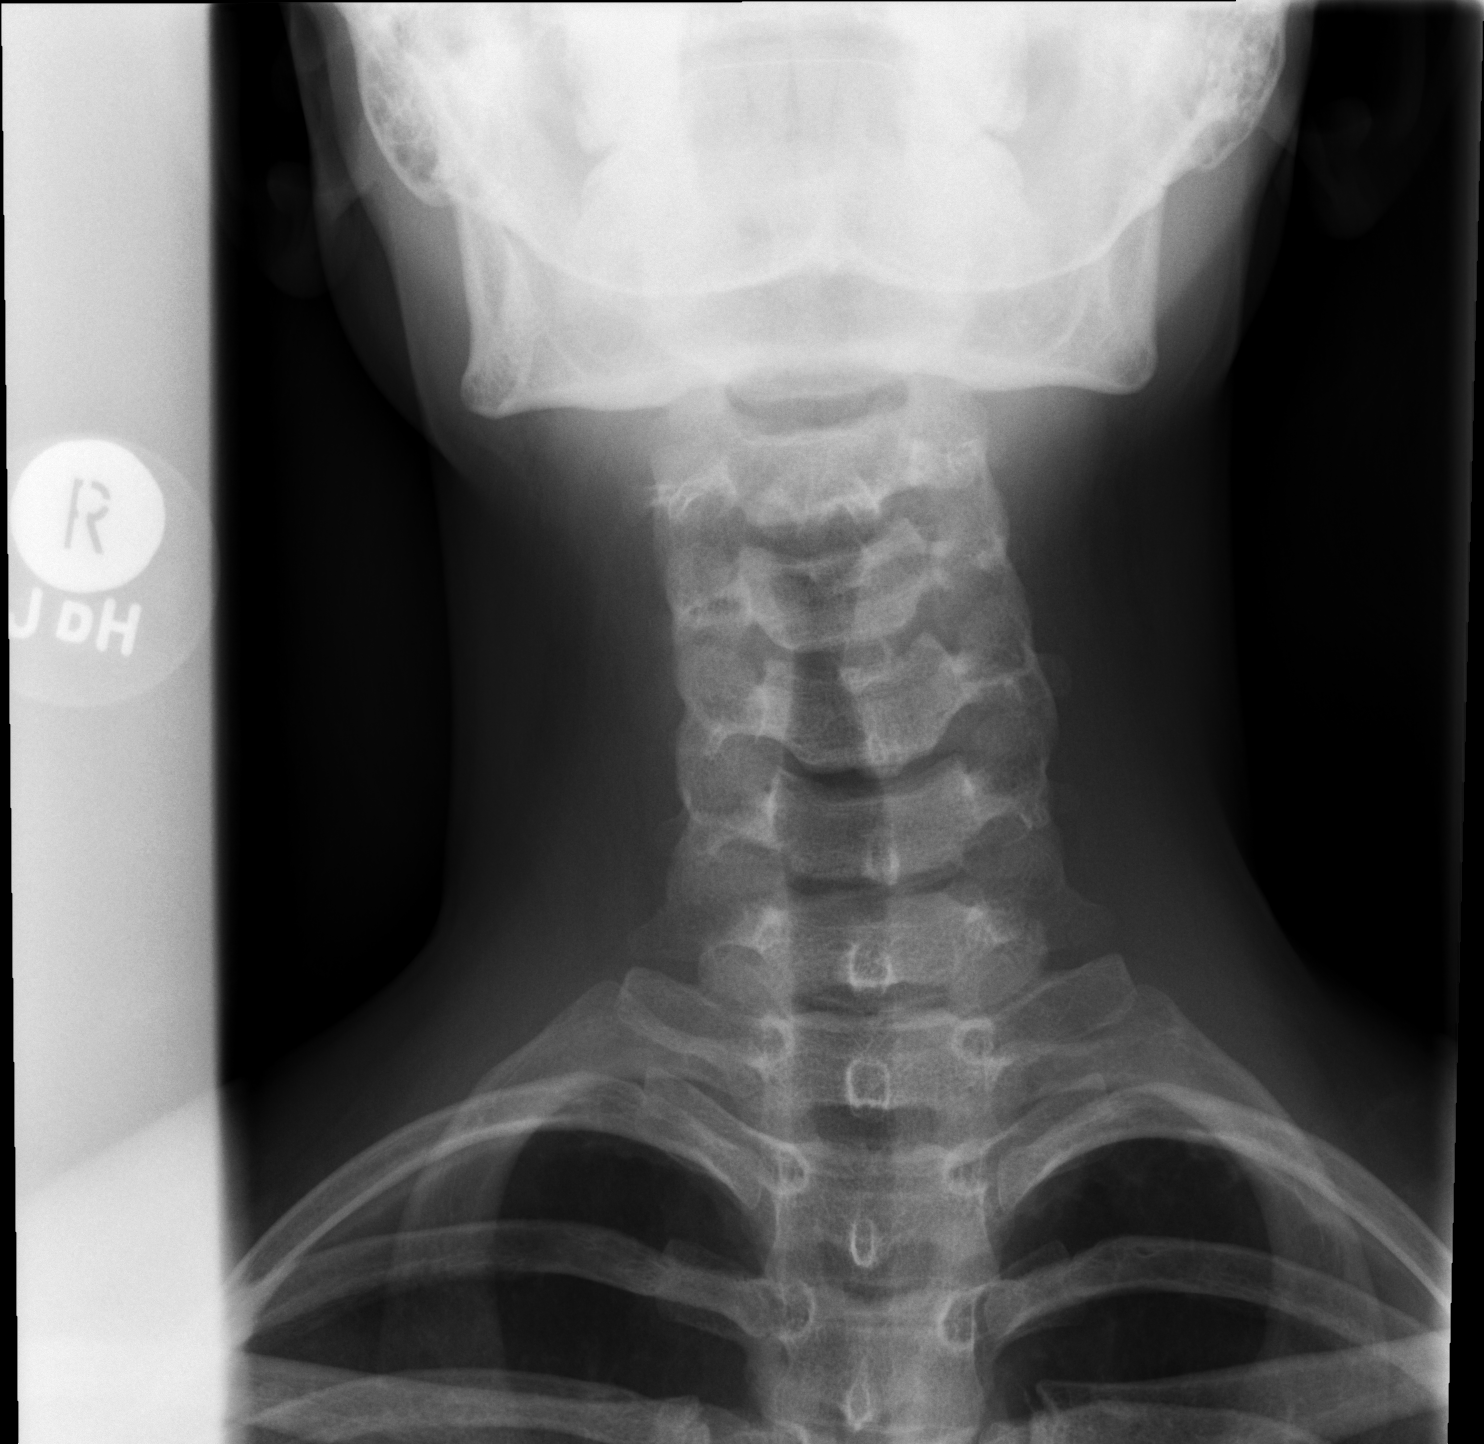

[t cervical spine odontoid]
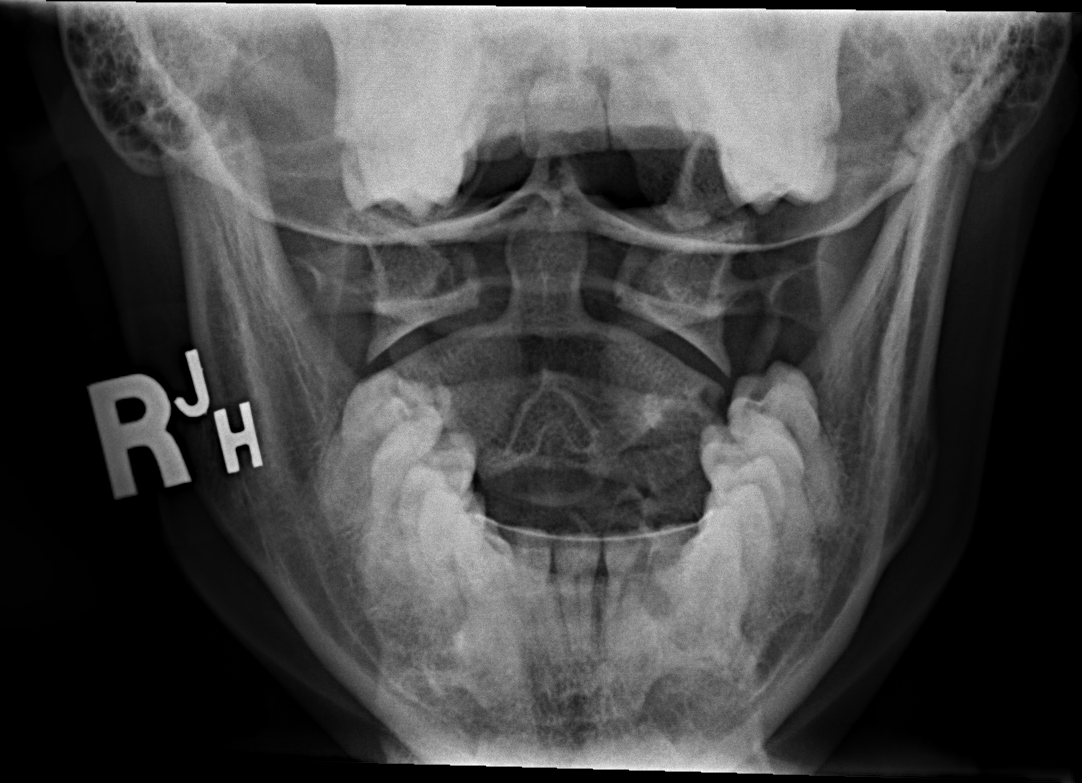

[3 of 3 positions shown; findings below may reference images not displayed]

FINDINGS: The cervical vertebrae are in normal alignment. Intervertebral disc
spaces appear normal. No prevertebral soft tissue swelling is seen.
The odontoid process is intact. The lung apices are clear.
IMPRESSION: Normal alignment.  Normal intervertebral disc spaces.

## 2018-02-10 NOTE — Progress Notes (Signed)
22 y.o. G0P0000 Single White or Caucasian Not Hispanic or Latino female here for annual exam.  She has had her nuvaring in for 6 weeks. Not using condoms. Last cycle was just prior to inserting this ring. Not using condoms. New partner for a couple of weeks. No dyspareunia.  She c/o breast tenderness for a week.   Period Cycle (Days): 28 Period Duration (Days): 4 days Period Pattern: Regular Menstrual Flow: Light, Moderate Menstrual Control: Tampon Menstrual Control Change Freq (Hours): changes tampon every 2 hours Dysmenorrhea: (!) Moderate Dysmenorrhea Symptoms: Cramping Patient's last menstrual period was 12/23/2017 (exact date).          Sexually active: Yes.    The current method of family planning is Nuvaring.    Exercising: Yes.    walking Smoker:  Yes, a couple of cigarettes every 1-2 days.   Health Maintenance: Pap:  12/09/2016 [facility] History of abnormal Pap:  Yes, 01/13/2017 leep CIN I and II, deep margin and ECC were negative for dysplasia TDaP:  12/20/2015 Gardasil: Never   reports that she has been smoking cigarettes. She has been smoking about 0.20 packs per day. She has never used smokeless tobacco. She reports that she drinks about 20.0 standard drinks of alcohol per week. She reports that she does not use drugs. She has 3-4 beers a day. Social. She works as a Production assistant, radio. Some days she doesn't drink.  She is going to [facility], wants to get into a PT program.   Past Medical History:  Diagnosis Date  . Abnormal Pap smear of cervix    2018 CINII  . Allergy   . Anxiety   . Depression   . Heart murmur   . IBS (irritable bowel syndrome)     Past Surgical History:  Procedure Laterality Date  . COLPOSCOPY     2018  . LEEP     2018  . WRIST SURGERY Left 2014 or 2015    Current Outpatient Medications  Medication Sig Dispense Refill  . cetirizine (ZYRTEC) 10 MG tablet Take 10 mg by mouth daily.    Marland Kitchen dicyclomine (BENTYL) 20 MG tablet Take 1 tablet (20 mg total) by mouth 3  (three) times daily as needed. 270 tablet 3  . etonogestrel-ethinyl estradiol (NUVARING) 0.12-0.015 MG/24HR vaginal ring Insert one ring vaginally and leave in place for 3 consecutive weeks, then remove for 1 week. 1 each 0  . fluticasone (FLONASE) 50 MCG/ACT nasal spray Place 2 sprays into both nostrils daily. 16 g 11  . Clobetasol Propionate (CLOBEX) 0.05 % shampoo Apply 1 application topically daily as needed. (Patient not taking: Reported on 11/10/2017) 118 mL 0   No current facility-administered medications for this visit.     Family History  Problem Relation Age of Onset  . Diabetes Mother   . Hyperlipidemia Mother   . Mental illness Mother   . Cancer Father   . Hyperlipidemia Father   . Mental illness Sister   . Mental illness Sister   Father with liver and colon cancer  Review of Systems  Constitutional: Negative.   HENT: Negative.   Eyes: Negative.   Respiratory: Negative.   Cardiovascular: Negative.   Gastrointestinal: Negative.   Endocrine: Negative.   Genitourinary:       Soreness with intercourse  Musculoskeletal: Negative.   Skin: Negative.   Allergic/Immunologic: Negative.   Neurological: Negative.   Hematological: Negative.   Psychiatric/Behavioral: Negative.   Can have discomfort with intercourse, recently deep. Positional.   Exam:  BP 120/70 (BP Location: Right Arm, Patient Position: Sitting, Cuff Size: Normal)   Pulse 64   Ht 5' 3.5" (1.613 m)   Wt 108 lb 6.4 oz (49.2 kg)   LMP 12/23/2017 (Exact Date)   BMI 18.90 kg/m   Weight change: @WEIGHTCHANGE @ Height:   Height: 5' 3.5" (161.3 cm)  Ht Readings from Last 3 Encounters:  02/14/18 5' 3.5" (1.613 m)  11/10/17 5' 2.25" (1.581 m)  10/22/17 5\' 3"  (1.6 m)    General appearance: alert, cooperative and appears stated age Head: Normocephalic, without obvious abnormality, atraumatic Neck: no adenopathy, supple, symmetrical, trachea midline and thyroid normal to inspection and palpation Lungs: clear to  auscultation bilaterally Cardiovascular: regular rate and rhythm Breasts: normal appearance, no masses or tenderness Abdomen: soft, non-tender; non distended,  no masses,  no organomegaly Extremities: extremities normal, atraumatic, no cyanosis or edema Skin: Skin color, texture, turgor normal. No rashes or lesions Lymph nodes: Cervical, supraclavicular, and axillary nodes normal. No abnormal inguinal nodes palpated Neurologic: Grossly normal   Pelvic: External genitalia:  no lesions              Urethra:  normal appearing urethra with no masses, tenderness or lesions              Bartholins and Skenes: normal                 Vagina: normal appearing vagina with normal color and discharge, no lesions              Cervix: no lesions               Bimanual Exam:  Uterus:  normal size, contour, position, consistency, mobility, non-tender              Adnexa: no mass, fullness, tenderness               Rectovaginal: Confirms               Anus:  normal sphincter tone, no lesions  Chaperone was present for exam.  A:  Well Woman with normal exam  Risk of pregnancy, has had the same ring in since early 9/19  Breast tenderness  ETOH use, discussed guidelines  H/O leep  FH of DM  P:   Discussed use of the nuvaring  Condoms encouraged  Screening labs, STD testing  Pap with hpv, GC/CT  Check UPT  If negative UPT, will start gardasil series  Discussed breast self exam  Discussed calcium and vit D intake   Addendum: UPT negative

## 2018-02-14 ENCOUNTER — Encounter: Payer: Self-pay | Admitting: Obstetrics and Gynecology

## 2018-02-14 ENCOUNTER — Ambulatory Visit (INDEPENDENT_AMBULATORY_CARE_PROVIDER_SITE_OTHER): Payer: Federal, State, Local not specified - PPO | Admitting: Obstetrics and Gynecology

## 2018-02-14 ENCOUNTER — Encounter

## 2018-02-14 ENCOUNTER — Other Ambulatory Visit: Payer: Self-pay

## 2018-02-14 ENCOUNTER — Other Ambulatory Visit (HOSPITAL_COMMUNITY)
Admission: RE | Admit: 2018-02-14 | Discharge: 2018-02-14 | Disposition: A | Discharge status: Home / Self Care | Payer: Federal, State, Local not specified - PPO | Source: Ambulatory Visit | Attending: Obstetrics and Gynecology | Admitting: Obstetrics and Gynecology

## 2018-02-14 VITALS — BP 120/70 | HR 64 | Ht 63.5 in | Wt 108.4 lb

## 2018-02-14 DIAGNOSIS — Z30015 Encounter for initial prescription of vaginal ring hormonal contraceptive: Secondary | ICD-10-CM

## 2018-02-14 DIAGNOSIS — N926 Irregular menstruation, unspecified: Secondary | ICD-10-CM | POA: Diagnosis not present

## 2018-02-14 DIAGNOSIS — Z23 Encounter for immunization: Secondary | ICD-10-CM

## 2018-02-14 DIAGNOSIS — Z3009 Encounter for other general counseling and advice on contraception: Secondary | ICD-10-CM

## 2018-02-14 DIAGNOSIS — Z113 Encounter for screening for infections with a predominantly sexual mode of transmission: Secondary | ICD-10-CM | POA: Diagnosis not present

## 2018-02-14 DIAGNOSIS — Z0000 Encounter for general adult medical examination without abnormal findings: Secondary | ICD-10-CM | POA: Diagnosis not present

## 2018-02-14 DIAGNOSIS — Z124 Encounter for screening for malignant neoplasm of cervix: Secondary | ICD-10-CM

## 2018-02-14 DIAGNOSIS — Z7185 Encounter for immunization safety counseling: Secondary | ICD-10-CM

## 2018-02-14 DIAGNOSIS — Z833 Family history of diabetes mellitus: Secondary | ICD-10-CM

## 2018-02-14 DIAGNOSIS — Z01419 Encounter for gynecological examination (general) (routine) without abnormal findings: Secondary | ICD-10-CM

## 2018-02-14 DIAGNOSIS — Z7189 Other specified counseling: Secondary | ICD-10-CM | POA: Diagnosis not present

## 2018-02-14 LAB — POCT URINE PREGNANCY: Preg Test, Ur: NEGATIVE

## 2018-02-14 MED ORDER — ETONOGESTREL-ETHINYL ESTRADIOL 0.12-0.015 MG/24HR VA RING: VAGINAL_RING | VAGINAL | 3 refills | Status: DC

## 2018-02-14 NOTE — Patient Instructions (Signed)
EXERCISE AND DIET:  We recommended that you start or continue a regular exercise program for good health. Regular exercise means any activity that makes your heart beat faster and makes you sweat.  We recommend exercising at least 30 minutes per day at least 3 days a week, preferably 4 or 5.  We also recommend a diet low in fat and sugar.  Inactivity, poor dietary choices and obesity can cause diabetes, heart attack, stroke, and kidney damage, among others.    ALCOHOL AND SMOKING:  Women should limit their alcohol intake to no more than 7 drinks/beers/glasses of wine (combined, not each!) per week. Moderation of alcohol intake to this level decreases your risk of breast cancer and liver damage. And of course, no recreational drugs are part of a healthy lifestyle.  And absolutely no smoking or even second hand smoke. Most people know smoking can cause heart and lung diseases, but did you know it also contributes to weakening of your bones? Aging of your skin?  Yellowing of your teeth and nails?  CALCIUM AND VITAMIN D:  Adequate intake of calcium and Vitamin D are recommended.  The recommendations for exact amounts of these supplements seem to change often, but generally speaking 1,000 mg of calcium (between diet and supplement) and 800 units of Vitamin D per day seems prudent. Certain women may benefit from higher intake of Vitamin D.  If you are among these women, your doctor will have told you during your visit.    PAP SMEARS:  Pap smears, to check for cervical cancer or precancers,  have traditionally been done yearly, although recent scientific advances have shown that most women can have pap smears less often.  However, every woman still should have a physical exam from her gynecologist every year. It will include a breast check, inspection of the vulva and vagina to check for abnormal growths or skin changes, a visual exam of the cervix, and then an exam to evaluate the size and shape of the uterus and  ovaries.  And after 22 years of age, a rectal exam is indicated to check for rectal cancers. We will also provide age appropriate advice regarding health maintenance, like when you should have certain vaccines, screening for sexually transmitted diseases, bone density testing, colonoscopy, mammograms, etc.   MAMMOGRAMS:  All women over 40 years old should have a yearly mammogram. Many facilities now offer a "3D" mammogram, which may cost around $50 extra out of pocket. If possible,  we recommend you accept the option to have the 3D mammogram performed.  It both reduces the number of women who will be called back for extra views which then turn out to be normal, and it is better than the routine mammogram at detecting truly abnormal areas.    COLONOSCOPY:  Colonoscopy to screen for colon cancer is recommended for all women at age 50.  We know, you hate the idea of the prep.  We agree, BUT, having colon cancer and not knowing it is worse!!  Colon cancer so often starts as a polyp that can be seen and removed at colonscopy, which can quite literally save your life!  And if your first colonoscopy is normal and you have no family history of colon cancer, most women don't have to have it again for 10 years.  Once every ten years, you can do something that may end up saving your life, right?  We will be happy to help you get it scheduled when you are ready.    Be sure to check your insurance coverage so you understand how much it will cost.  It may be covered as a preventative service at no cost, but you should check your particular policy.     Ethinyl Estradiol; Etonogestrel vaginal ring What is this medicine? ETHINYL ESTRADIOL; ETONOGESTREL (ETH in il es tra DYE ole; et oh noe JES trel) vaginal ring is a flexible, vaginal ring used as a contraceptive (birth control method). This medicine combines two types of female hormones, an estrogen and a progestin. This ring is used to prevent ovulation and pregnancy. Each  ring is effective for one month. This medicine may be used for other purposes; ask your health care provider or pharmacist if you have questions. COMMON BRAND NAME(S): NuvaRing What should I tell my health care provider before I take this medicine? They need to know if you have or ever had any of these conditions: -abnormal vaginal bleeding -blood vessel disease or blood clots -breast, cervical, endometrial, ovarian, liver, or uterine cancer -diabetes -gallbladder disease -heart disease or recent heart attack -high blood pressure -high cholesterol -kidney disease -liver disease -migraine headaches -stroke -systemic lupus erythematosus (SLE) -tobacco smoker -an unusual or allergic reaction to estrogens, progestins, other medicines, foods, dyes, or preservatives -pregnant or trying to get pregnant -breast-feeding How should I use this medicine? Insert the ring into your vagina as directed. Follow the directions on the prescription label. The ring will remain place for 3 weeks and is then removed for a 1-week break. A new ring is inserted 1 week after the last ring was removed, on the same day of the week. Check often to make sure the ring is still in place, especially before and after sexual intercourse. If the ring was out of the vagina for an unknown amount of time, you may not be protected from pregnancy. Perform a pregnancy test and call your doctor. Do not use more often than directed. A patient package insert for the product will be given with each prescription and refill. Read this sheet carefully each time. The sheet may change frequently. Contact your pediatrician regarding the use of this medicine in children. Special care may be needed. This medicine has been used in female children who have started having menstrual periods. Overdosage: If you think you have taken too much of this medicine contact a poison control center or emergency room at once. NOTE: This medicine is only for  you. Do not share this medicine with others. What if I miss a dose? You will need to replace your vaginal ring once a month as directed. If the ring should slip out, or if you leave it in longer or shorter than you should, contact your health care professional for advice. What may interact with this medicine? Do not take this medicine with the following medication: -dasabuvir; ombitasvir; paritaprevir; ritonavir -ombitasvir; paritaprevir; ritonavir This medicine may also interact with the following medications: -acetaminophen -antibiotics or medicines for infections, especially rifampin, rifabutin, rifapentine, and griseofulvin, and possibly penicillins or tetracyclines -aprepitant -ascorbic acid (vitamin C) -atorvastatin -barbiturate medicines, such as phenobarbital -bosentan -carbamazepine -caffeine -clofibrate -cyclosporine -dantrolene -doxercalciferol -felbamate -grapefruit juice -hydrocortisone -medicines for anxiety or sleeping problems, such as diazepam or temazepam -medicines for diabetes, including pioglitazone -modafinil -mycophenolate -nefazodone -oxcarbazepine -phenytoin -prednisolone -ritonavir or other medicines for HIV infection or AIDS -rosuvastatin -selegiline -soy isoflavones supplements -St. John's wort -tamoxifen or raloxifene -theophylline -thyroid hormones -topiramate -warfarin This list may not describe all possible interactions. Give your health care provider a list  of all the medicines, herbs, non-prescription drugs, or dietary supplements you use. Also tell them if you smoke, drink alcohol, or use illegal drugs. Some items may interact with your medicine. What should I watch for while using this medicine? Visit your doctor or health care professional for regular checks on your progress. You will need a regular breast and pelvic exam and Pap smear while on this medicine. Use an additional method of contraception during the first cycle that you use  this ring. Do not use a diaphragm or female condom, as the ring can interfere with these birth control methods and their proper placement. If you have any reason to think you are pregnant, stop using this medicine right away and contact your doctor or health care professional. If you are using this medicine for hormone related problems, it may take several cycles of use to see improvement in your condition. Smoking increases the risk of getting a blood clot or having a stroke while you are using hormonal birth control, especially if you are more than 22 years old. You are strongly advised not to smoke. This medicine can make your body retain fluid, making your fingers, hands, or ankles swell. Your blood pressure can go up. Contact your doctor or health care professional if you feel you are retaining fluid. This medicine can make you more sensitive to the sun. Keep out of the sun. If you cannot avoid being in the sun, wear protective clothing and use sunscreen. Do not use sun lamps or tanning beds/booths. If you wear contact lenses and notice visual changes, or if the lenses begin to feel uncomfortable, consult your eye care specialist. In some women, tenderness, swelling, or minor bleeding of the gums may occur. Notify your dentist if this happens. Brushing and flossing your teeth regularly may help limit this. See your dentist regularly and inform your dentist of the medicines you are taking. If you are going to have elective surgery, you may need to stop using this medicine before the surgery. Consult your health care professional for advice. This medicine does not protect you against HIV infection (AIDS) or any other sexually transmitted diseases. What side effects may I notice from receiving this medicine? Side effects that you should report to your doctor or health care professional as soon as possible: -breast tissue changes or discharge -changes in vaginal bleeding during your period or between  your periods -chest pain -coughing up blood -dizziness or fainting spells -headaches or migraines -leg, arm or groin pain -severe or sudden headaches -stomach pain (severe) -sudden shortness of breath -sudden loss of coordination, especially on one side of the body -speech problems -symptoms of vaginal infection like itching, irritation or unusual discharge -tenderness in the upper abdomen -vomiting -weakness or numbness in the arms or legs, especially on one side of the body -yellowing of the eyes or skin Side effects that usually do not require medical attention (report to your doctor or health care professional if they continue or are bothersome): -breakthrough bleeding and spotting that continues beyond the 3 initial cycles of pills -breast tenderness -mood changes, anxiety, depression, frustration, anger, or emotional outbursts -increased sensitivity to sun or ultraviolet light -nausea -skin rash, acne, or brown spots on the skin -weight gain (slight) This list may not describe all possible side effects. Call your doctor for medical advice about side effects. You may report side effects to FDA at 1-800-FDA-1088. Where should I keep my medicine? Keep out of the reach of children. Store at   room temperature between 15 and 30 degrees C (59 and 86 degrees F) for up to 4 months. The product will expire after 4 months. Protect from light. Throw away any unused medicine after the expiration date. NOTE: This sheet is a summary. It may not cover all possible information. If you have questions about this medicine, talk to your doctor, pharmacist, or health care provider.  2018 Elsevier/Gold Standard (2015-12-13 17:00:31)  Breast Self-Awareness Breast self-awareness means being familiar with how your breasts look and feel. It involves checking your breasts regularly and reporting any changes to your health care provider. Practicing breast self-awareness is important. A change in your  breasts can be a sign of a serious medical problem. Being familiar with how your breasts look and feel allows you to find any problems early, when treatment is more likely to be successful. All women should practice breast self-awareness, including women who have had breast implants. How to do a breast self-exam One way to learn what is normal for your breasts and whether your breasts are changing is to do a breast self-exam. To do a breast self-exam: Look for Changes  1. Remove all the clothing above your waist. 2. Stand in front of a mirror in a room with good lighting. 3. Put your hands on your hips. 4. Push your hands firmly downward. 5. Compare your breasts in the mirror. Look for differences between them (asymmetry), such as: ? Differences in shape. ? Differences in size. ? Puckers, dips, and bumps in one breast and not the other. 6. Look at each breast for changes in your skin, such as: ? Redness. ? Scaly areas. 7. Look for changes in your nipples, such as: ? Discharge. ? Bleeding. ? Dimpling. ? Redness. ? A change in position. Feel for Changes  Carefully feel your breasts for lumps and changes. It is best to do this while lying on your back on the floor and again while sitting or standing in the shower or tub with soapy water on your skin. Feel each breast in the following way:  Place the arm on the side of the breast you are examining above your head.  Feel your breast with the other hand.  Start in the nipple area and make  inch (2 cm) overlapping circles to feel your breast. Use the pads of your three middle fingers to do this. Apply light pressure, then medium pressure, then firm pressure. The light pressure will allow you to feel the tissue closest to the skin. The medium pressure will allow you to feel the tissue that is a little deeper. The firm pressure will allow you to feel the tissue close to the ribs.  Continue the overlapping circles, moving downward over the  breast until you feel your ribs below your breast.  Move one finger-width toward the center of the body. Continue to use the  inch (2 cm) overlapping circles to feel your breast as you move slowly up toward your collarbone.  Continue the up and down exam using all three pressures until you reach your armpit.  Write Down What You Find  Write down what is normal for each breast and any changes that you find. Keep a written record with breast changes or normal findings for each breast. By writing this information down, you do not need to depend only on memory for size, tenderness, or location. Write down where you are in your menstrual cycle, if you are still menstruating. If you are having trouble noticing differences  in your breasts, do not get discouraged. With time you will become more familiar with the variations in your breasts and more comfortable with the exam. How often should I examine my breasts? Examine your breasts every month. If you are breastfeeding, the best time to examine your breasts is after a feeding or after using a breast pump. If you menstruate, the best time to examine your breasts is 5-7 days after your period is over. During your period, your breasts are lumpier, and it may be more difficult to notice changes. When should I see my health care provider? See your health care provider if you notice:  A change in shape or size of your breasts or nipples.  A change in the skin of your breast or nipples, such as a reddened or scaly area.  Unusual discharge from your nipples.  A lump or thick area that was not there before.  Pain in your breasts.  Anything that concerns you.  This information is not intended to replace advice given to you by your health care provider. Make sure you discuss any questions you have with your health care provider. Document Released: 04/06/2005 Document Revised: 09/12/2015 Document Reviewed: 02/24/2015 Elsevier Interactive Patient Education   [employer]2018 Elsevier Inc[employer]

## 2018-02-15 LAB — CBC
Hematocrit: 39 % (ref 34.0–46.6)
Hemoglobin: 13.1 g/dL (ref 11.1–15.9)
MCH: 28.5 pg (ref 26.6–33.0)
MCHC: 33.6 g/dL (ref 31.5–35.7)
MCV: 85 fL (ref 79–97)
Platelets: 210 10*3/uL (ref 150–450)
RBC: 4.59 x10E6/uL (ref 3.77–5.28)
RDW: 12.7 % (ref 12.3–15.4)
WBC: 5.6 10*3/uL (ref 3.4–10.8)

## 2018-02-15 LAB — COMPREHENSIVE METABOLIC PANEL
ALT: 13 IU/L (ref 0–32)
AST: 19 IU/L (ref 0–40)
Albumin/Globulin Ratio: 1.8 (ref 1.2–2.2)
Albumin: 4.2 g/dL (ref 3.5–5.5)
Alkaline Phosphatase: 35 IU/L — ABNORMAL LOW (ref 39–117)
BUN/Creatinine Ratio: 15 (ref 9–23)
BUN: 11 mg/dL (ref 6–20)
Bilirubin Total: 0.4 mg/dL (ref 0.0–1.2)
CO2: 22 mmol/L (ref 20–29)
Calcium: 9 mg/dL (ref 8.7–10.2)
Chloride: 103 mmol/L (ref 96–106)
Creatinine, Ser: 0.75 mg/dL (ref 0.57–1.00)
GFR calc Af Amer: 131 mL/min/{1.73_m2} (ref >59)
GFR calc non Af Amer: 114 mL/min/{1.73_m2} (ref >59)
Globulin, Total: 2.4 g/dL (ref 1.5–4.5)
Glucose: 74 mg/dL (ref 65–99)
Potassium: 4.2 mmol/L (ref 3.5–5.2)
Sodium: 139 mmol/L (ref 134–144)
Total Protein: 6.6 g/dL (ref 6.0–8.5)

## 2018-02-15 LAB — HEP, RPR, HIV PANEL
HIV Screen 4th Generation wRfx: NONREACTIVE
Hepatitis B Surface Ag: NEGATIVE
RPR Ser Ql: NONREACTIVE

## 2018-02-15 LAB — LIPID PANEL
Chol/HDL Ratio: 2 ratio (ref 0.0–4.4)
Cholesterol, Total: 172 mg/dL (ref 100–199)
HDL: 88 mg/dL (ref >39)
LDL Calculated: 66 mg/dL (ref 0–99)
Triglycerides: 88 mg/dL (ref 0–149)
VLDL Cholesterol Cal: 18 mg/dL (ref 5–40)

## 2018-02-15 LAB — HEMOGLOBIN A1C
Est. average glucose Bld gHb Est-mCnc: 97 mg/dL
Hgb A1c MFr Bld: 5 % (ref 4.8–5.6)

## 2018-02-15 LAB — HEPATITIS C ANTIBODY: Hep C Virus Ab: <0.1 s/co ratio (ref 0.0–0.9)

## 2018-02-16 LAB — CYTOLOGY - PAP
Chlamydia: NEGATIVE
Diagnosis: NEGATIVE
HPV: NOT DETECTED
Neisseria Gonorrhea: NEGATIVE
Trichomonas: NEGATIVE

## 2018-04-19 ENCOUNTER — Ambulatory Visit: Payer: Federal, State, Local not specified - PPO

## 2018-04-19 NOTE — Progress Notes (Deleted)
Patient in today for second Gardasil injection.   Contraception: nuvaring LMP: *** Last AEX: 02-14-18 with Dr. Oscar LaJertson   Injection given in left deltoid. Patient tolerated shot well.   Patient informed next injection due in about 4 months (after 08-19-18).  Advised patient, if not on birth control, to return for next injection with cycle.   Routed to provider for final review.  Encounter closed.

## 2018-04-21 ENCOUNTER — Ambulatory Visit: Payer: Federal, State, Local not specified - PPO

## 2018-04-21 ENCOUNTER — Telehealth: Payer: Self-pay | Admitting: Obstetrics and Gynecology

## 2018-04-21 NOTE — Progress Notes (Deleted)
Patient in today for second Gardasil injection.   Contraception: nuvaring  LMP: *** Last AEX: 02-14-18 with Dr. Oscar LaJertson   Injection given in left deltoid. Patient tolerated shot well.   Patient informed next injection due in about four months. (after 08-20-2018)  Advised patient, if not on birth control, to return for next injection with cycle.   Routed to provider for final review.  Encounter closed.

## 2018-04-21 NOTE — Telephone Encounter (Signed)
Left message on voicemail regarding missed appointment.  °

## 2018-05-31 ENCOUNTER — Telehealth: Payer: Self-pay | Admitting: Obstetrics and Gynecology

## 2018-05-31 NOTE — Progress Notes (Signed)
GYNECOLOGY  VISIT   HPI: 23 y.o.   Single White or Caucasian Not Hispanic or Latino  female   [id number] with Patient's last menstrual period was 05/21/2018 (exact date).   here for STD testing and second HPV vaccine. She has had a new partner. She notices an increase in vaginal d/c and odor for the last 1-2 weeks. Slight itching, no burning or irritation. The d/c is watery, yellow, more than normal. Not using condoms.  GYNECOLOGIC HISTORY: Patient's last menstrual period was 05/21/2018 (exact date). Contraception: Nuvaring Menopausal hormone therapy: None        OB History    Gravida  0   Para  0   Term  0   Preterm  0   AB  0   Living  0     SAB  0   TAB  0   Ectopic  0   Multiple  0   Live Births  0              Patient Active Problem List   Diagnosis Date Noted  . Acute pharyngitis 01/07/2016  . Tinea corporis 01/07/2016  . Anxiety 12/30/2015  . Depression, major, single episode, moderate (HCC) 12/30/2015  . Hand paresthesia 12/30/2015  . Neck pain 12/30/2015  . Candida vaginitis 12/12/2015  . History of abnormal cervical Pap smear 12/12/2015  . Acne 12/03/2015  . Encounter for refill of prescription for contraception 12/03/2015  . Irregular bowel habits 12/03/2015    Past Medical History:  Diagnosis Date  . Abnormal Pap smear of cervix    2018 [facility]  . Allergy   . Anxiety   . Depression   . Heart murmur   . IBS (irritable bowel syndrome)     Past Surgical History:  Procedure Laterality Date  . COLPOSCOPY     2018  . LEEP     2018  . WRIST SURGERY Left 2014 or 2015    Current Outpatient Medications  Medication Sig Dispense Refill  . cetirizine (ZYRTEC) 10 MG tablet Take 10 mg by mouth daily.    Marland Kitchen dicyclomine (BENTYL) 20 MG tablet Take 1 tablet (20 mg total) by mouth 3 (three) times daily as needed. 270 tablet 3  . etonogestrel-ethinyl estradiol (NUVARING) 0.12-0.015 MG/24HR vaginal ring Insert one ring vaginally and leave in place  for 3 consecutive weeks, then remove for 1 week. 3 each 3  . fluticasone (FLONASE) 50 MCG/ACT nasal spray Place 2 sprays into both nostrils daily. 16 g 11  . Clobetasol Propionate (CLOBEX) 0.05 % shampoo Apply 1 application topically daily as needed. (Patient not taking: Reported on 11/10/2017) 118 mL 0   No current facility-administered medications for this visit.      ALLERGIES: Bee venom  Family History  Problem Relation Age of Onset  . Diabetes Mother   . Hyperlipidemia Mother   . Mental illness Mother   . Cancer Father   . Hyperlipidemia Father   . Colon cancer Father 46       mets to his liver.  . Mental illness Sister   . Mental illness Sister     Social History   Socioeconomic History  . Marital status: Single    Spouse name: Not on file  . Number of children: Not on file  . Years of education: Not on file  . Highest education level: Not on file  Occupational History  . Not on file  Social Needs  . Financial resource strain: Not on file  .  Food insecurity:    Worry: Not on file    Inability: Not on file  . Transportation needs:    Medical: Not on file    Non-medical: Not on file  Tobacco Use  . Smoking status: Current Some Day Smoker    Packs/day: 0.20    Types: Cigarettes  . Smokeless tobacco: Never Used  Substance and Sexual Activity  . Alcohol use: Yes    Alcohol/week: 14.0 standard drinks    Types: 14 Glasses of wine per week  . Drug use: No  . Sexual activity: Yes    Partners: Male    Birth control/protection: Inserts    Comment: Nuvaring  Lifestyle  . Physical activity:    Days per week: Not on file    Minutes per session: Not on file  . Stress: Not on file  Relationships  . Social connections:    Talks on phone: Not on file    Gets together: Not on file    Attends religious service: Not on file    Active member of club or organization: Not on file    Attends meetings of clubs or organizations: Not on file    Relationship status: Not on  file  . Intimate partner violence:    Fear of current or ex partner: Not on file    Emotionally abused: Not on file    Physically abused: Not on file    Forced sexual activity: Not on file  Other Topics Concern  . Not on file  Social History Narrative  . Not on file    Review of Systems  Constitutional: Negative.   HENT: Negative.   Eyes: Negative.   Respiratory: Negative.   Cardiovascular: Negative.   Gastrointestinal: Negative.   Genitourinary:       Vaginal itching Vaginal discharge  Musculoskeletal: Negative.   Skin: Negative.   Neurological: Negative.   Endo/Heme/Allergies: Negative.   Psychiatric/Behavioral: Negative.     PHYSICAL EXAMINATION:    BP 120/82 (BP Location: Right Arm, Patient Position: Sitting, Cuff Size: Normal)   Pulse 84   Wt 119 lb (54 kg)   LMP 05/21/2018 (Exact Date)   BMI 20.75 kg/m     General appearance: alert, cooperative and appears stated age  Pelvic: External genitalia:  no lesions, minimal erythema              Urethra:  normal appearing urethra with no masses, tenderness or lesions              Bartholins and Skenes: normal                 Vagina: normal appearing vagina with a slight increase in creamy yellow vaginal discharge.              Cervix: evidence of prior leep, no lesions or abnormal cervical discharge  Chaperone was present for exam.  Wet prep: ? clue, no trich, + wbc KOH: no yeast PH: 4.5   ASSESSMENT Screening STD, declines blood work Vaginal d/c, with odor. Vaginal slides not clear Vulvar irritation, possible contact irritation   PLAN Genprobe Affirm, treatment depending on results Declines blood work Discussed vulvar skin care, information given Second gardasil today   An After Visit Summary was printed and given to the patient.

## 2018-05-31 NOTE — Telephone Encounter (Signed)
Patient is coming in tomorrow afternoon for STD testing. States she missed her second gardasil due in December and would like to know if she needs to restart or not.

## 2018-05-31 NOTE — Telephone Encounter (Signed)
Call to patient.  Advised does not need to restart series. Can get HPV vaccine tomorrow if Dr. Oscar La orders vaccine.  Encounter closed.

## 2018-06-01 ENCOUNTER — Other Ambulatory Visit: Payer: Self-pay

## 2018-06-01 ENCOUNTER — Encounter: Payer: Self-pay | Admitting: Obstetrics and Gynecology

## 2018-06-01 ENCOUNTER — Ambulatory Visit (INDEPENDENT_AMBULATORY_CARE_PROVIDER_SITE_OTHER): Payer: Federal, State, Local not specified - PPO | Admitting: Obstetrics and Gynecology

## 2018-06-01 VITALS — BP 120/82 | HR 84 | Wt 119.0 lb

## 2018-06-01 DIAGNOSIS — N9089 Other specified noninflammatory disorders of vulva and perineum: Secondary | ICD-10-CM | POA: Diagnosis not present

## 2018-06-01 DIAGNOSIS — Z23 Encounter for immunization: Secondary | ICD-10-CM

## 2018-06-01 DIAGNOSIS — Z113 Encounter for screening for infections with a predominantly sexual mode of transmission: Secondary | ICD-10-CM

## 2018-06-01 DIAGNOSIS — N898 Other specified noninflammatory disorders of vagina: Secondary | ICD-10-CM | POA: Diagnosis not present

## 2018-06-02 LAB — VAGINITIS/VAGINOSIS, DNA PROBE
Candida Species: NEGATIVE
Gardnerella vaginalis: NEGATIVE
Trichomonas vaginosis: NEGATIVE

## 2018-06-04 LAB — GC/CHLAMYDIA PROBE AMP
Chlamydia trachomatis, NAA: NEGATIVE
Neisseria gonorrhoeae by PCR: NEGATIVE

## 2018-11-02 ENCOUNTER — Ambulatory Visit: Payer: Federal, State, Local not specified - PPO | Admitting: Emergency Medicine

## 2018-11-02 ENCOUNTER — Encounter: Payer: Self-pay | Admitting: Emergency Medicine

## 2018-11-02 ENCOUNTER — Other Ambulatory Visit: Payer: Self-pay

## 2018-11-02 VITALS — BP 133/83 | HR 88 | Temp 98.4°F | Resp 16 | Ht 63.0 in | Wt 115.0 lb

## 2018-11-02 DIAGNOSIS — R109 Unspecified abdominal pain: Secondary | ICD-10-CM

## 2018-11-02 DIAGNOSIS — G43009 Migraine without aura, not intractable, without status migrainosus: Secondary | ICD-10-CM | POA: Diagnosis not present

## 2018-11-02 DIAGNOSIS — R519 Headache, unspecified: Secondary | ICD-10-CM

## 2018-11-02 DIAGNOSIS — R51 Headache: Secondary | ICD-10-CM

## 2018-11-02 MED ORDER — BUTALBITAL-ASPIRIN-CAFFEINE 50-325-40 MG PO CAPS: 1.0000 | ORAL_CAPSULE | Freq: Four times a day (QID) | ORAL | 0 refills | Status: DC | PRN

## 2018-11-02 MED ORDER — DICYCLOMINE HCL 20 MG PO TABS: 20.0000 mg | ORAL_TABLET | Freq: Three times a day (TID) | ORAL | 3 refills | Status: DC | PRN

## 2018-11-02 NOTE — Progress Notes (Signed)
Amanda Gutierrez 23 y.o.   Chief Complaint  Patient presents with  . Temporomandibular Joint Pain    pt states her temples are hurting,pt states she also has jaw pain/ x 1wk  . Medication Refill    bentyl    HISTORY OF PRESENT ILLNESS: This is a 23 y.o. female complaining of steady almost daily pain to both temples along with headaches for the past 1 to 2 weeks with "sore eye sockets", worse when eating salty things.  No other associated symptoms.  Past medical history includes generalized anxiety and occasional migraine headaches.  No changes in her lifestyle habits.  No new medications.  No new diets or out of the routine activities.  Denies head injuries.  Tried ibuprofen and Tylenol with only partial relief.  HPI   Prior to Admission medications   Medication Sig Start Date End Date Taking? Authorizing Provider  cetirizine (ZYRTEC) 10 MG tablet Take 10 mg by mouth daily.   Yes [provider]  dicyclomine (BENTYL) 20 MG tablet Take 1 tablet (20 mg total) by mouth 3 (three) times daily as needed. 12/09/16  Yes Tereasa Coop, PA-C  etonogestrel-ethinyl estradiol (NUVARING) 0.12-0.015 MG/24HR vaginal ring Insert one ring vaginally and leave in place for 3 consecutive weeks, then remove for 1 week. 02/14/18  Yes Salvadore Dom, MD  fluticasone [facility]) 50 MCG/ACT nasal spray Place 2 sprays into both nostrils daily. 06/20/16  Yes Tereasa Coop, PA-C  Clobetasol Propionate (CLOBEX) 0.05 % shampoo Apply 1 application topically daily as needed. Patient not taking: Reported on 11/10/2017 10/22/17   Tereasa Coop, PA-C    Allergies  Allergen Reactions  . Bee Venom Swelling    Patient Active Problem List   Diagnosis Date Noted  . Anxiety 12/30/2015  . Acne 12/03/2015    Past Medical History:  Diagnosis Date  . Abnormal Pap smear of cervix    2018 CINII  . Allergy   . Anxiety   . Depression   . Heart murmur   . IBS (irritable bowel syndrome)     Past Surgical  History:  Procedure Laterality Date  . COLPOSCOPY     2018  . LEEP     2018  . WRIST SURGERY Left 2014 or 2015    Social History   Socioeconomic History  . Marital status: Single    Spouse name: Not on file  . Number of children: Not on file  . Years of education: Not on file  . Highest education level: Not on file  Occupational History  . Not on file  Social Needs  . Financial resource strain: Not on file  . Food insecurity    Worry: Not on file    Inability: Not on file  . Transportation needs    Medical: Not on file    Non-medical: Not on file  Tobacco Use  . Smoking status: Current Some Day Smoker    Packs/day: 0.20    Types: Cigarettes  . Smokeless tobacco: Never Used  Substance and Sexual Activity  . Alcohol use: Yes    Alcohol/week: 14.0 standard drinks    Types: 14 Glasses of wine per week  . Drug use: No  . Sexual activity: Yes    Partners: Male    Birth control/protection: Inserts    Comment: Nuvaring  Lifestyle  . Physical activity    Days per week: Not on file    Minutes per session: Not on file  . Stress: Not on file  Relationships  . Social Musicianconnections    Talks on phone: Not on file    Gets together: Not on file    Attends religious service: Not on file    Active member of club or organization: Not on file    Attends meetings of clubs or organizations: Not on file    Relationship status: Not on file  . Intimate partner violence    Fear of current or ex partner: Not on file    Emotionally abused: Not on file    Physically abused: Not on file    Forced sexual activity: Not on file  Other Topics Concern  . Not on file  Social History Narrative  . Not on file    Family History  Problem Relation Age of Onset  . Diabetes Mother   . Hyperlipidemia Mother   . Mental illness Mother   . Cancer Father   . Hyperlipidemia Father   . Colon cancer Father 46       mets to his liver.  . Mental illness Sister   . Mental illness Sister       Review of Systems  Constitutional: Negative.  Negative for chills, fever and malaise/fatigue.  HENT: Negative.  Negative for congestion, ear pain, hearing loss, nosebleeds, sinus pain and sore throat.   Eyes: Positive for pain. Negative for blurred vision, double vision, photophobia, discharge and redness.  Respiratory: Negative.  Negative for cough, shortness of breath and wheezing.   Cardiovascular: Negative.  Negative for chest pain, palpitations and leg swelling.  Gastrointestinal: Negative.  Negative for abdominal pain, diarrhea, nausea and vomiting.  Genitourinary: Negative.   Musculoskeletal: Negative.   Skin: Negative.  Negative for rash.  Neurological: Negative.  Negative for dizziness and headaches.  All other systems reviewed and are negative.  Vitals:   11/02/18 1456  BP: 133/83  Pulse: 88  Resp: 16  Temp: 98.4 F (36.9 C)  SpO2: 95%     Physical Exam Vitals signs reviewed.  Constitutional:      Appearance: Normal appearance.  HENT:     Head: Normocephalic and atraumatic.     Comments: No TMJ tenderness or pain during range of motion. No erythema, tenderness or swelling either temporal artery areas.  No sign of temporal arteritis.    Right Ear: Tympanic membrane, ear canal and external ear normal.     Left Ear: Tympanic membrane, ear canal and external ear normal.     Nose: Nose normal. No congestion.     Mouth/Throat:     Mouth: Mucous membranes are moist.     Pharynx: Oropharynx is clear.  Eyes:     Extraocular Movements: Extraocular movements intact.     Conjunctiva/sclera: Conjunctivae normal.     Pupils: Pupils are equal, round, and reactive to light.  Neck:     Musculoskeletal: Normal range of motion and neck supple. No muscular tenderness.  Cardiovascular:     Rate and Rhythm: Normal rate and regular rhythm.     Pulses: Normal pulses.     Heart sounds: Normal heart sounds.  Pulmonary:     Effort: Pulmonary effort is normal.     Breath sounds:  Normal breath sounds.  Lymphadenopathy:     Cervical: No cervical adenopathy.  Skin:    General: Skin is warm and dry.     Capillary Refill: Capillary refill takes less than 2 seconds.  Neurological:     General: No focal deficit present.     Mental Status: She is  alert and oriented to person, place, and time.  Psychiatric:        Mood and Affect: Mood normal.        Behavior: Behavior normal.      ASSESSMENT & PLAN: Michelle Nasutilena was seen today for temporomandibular joint pain and medication refill.  Diagnoses and all orders for this visit:  Atypical migraine -     butalbital-aspirin-caffeine (FIORINAL) 50-325-40 MG capsule; Take 1 capsule by mouth every 6 (six) hours as needed for headache. -     CBC with Differential/Platelet -     Comprehensive metabolic panel -     Sedimentation Rate  Temporal pain  Stomach pain -     dicyclomine (BENTYL) 20 MG tablet; Take 1 tablet (20 mg total) by mouth 3 (three) times daily as needed.    Patient Instructions       If you have lab work done today you will be contacted with your lab results within the next 2 weeks.  If you have not heard from us then please contact us. The fastest way to get your results is to register for My Chart.   IF you received an x-ray today, you will receive an invoice from [facility]. Please contact [facility] at [phone] with questions or concerns regarding your invoice.   IF you received labwork today, you will receive an invoice from [facility]. Please contact [facility] at [phone] with questions or concerns regarding your invoice.   Our billing staff will not be able to assist you with questions regarding bills from these companies.  You will be contacted with the lab results as soon as they are available. The fastest way to get your results is to activate your My Chart account. Instructions are located on the last page of this paperwork. If you have not heard from us regarding the  results in 2 weeks, please contact this office.      General Headache Without Cause A headache is pain or discomfort that is felt around the head or neck area. There are many causes and types of headaches. In some cases, the cause may not be found. Follow these instructions at home: Watch your condition for any changes. Let your doctor know about them. Take these steps to help with your condition: Managing pain      Take over-the-counter and prescription medicines only as told by your doctor.  Lie down in a dark, quiet room when you have a headache.  If told, put ice on your head and neck area: ? Put ice in a plastic bag. ? Place a towel between your skin and the bag. ? Leave the ice on for 20 minutes, 2-3 times per day.  If told, put heat on the affected area. Use the heat source that your doctor recommends, such as a moist heat pack or a heating pad. ? Place a towel between your skin and the heat source. ? Leave the heat on for 20-30 minutes. ? Remove the heat if your skin turns bright red. This is very important if you are unable to feel pain, heat, or cold. You may have a greater risk of getting burned.  Keep lights dim if bright lights bother you or make your headaches worse. Eating and drinking  Eat meals on a regular schedule.  If you drink alcohol: ? Limit how much you use to:  0-1 drink a day for women.  0-2 drinks a day for men. ? Be aware of how much alcohol is in  your drink. In the [place] one drink equals one 12 oz bottle of beer (355 mL), one 5 oz glass of wine (148 mL), or one 1 oz glass of hard liquor (44 mL).  Stop drinking caffeine, or reduce how much caffeine you drink. General instructions   Keep a journal to find out if certain things bring on headaches. For example, write down: ? What you eat and drink. ? How much sleep you get. ? Any change to your diet or medicines.  Get a massage or try other ways to relax.  Limit stress.  Sit up straight.  Do not tighten (tense) your muscles.  Do not use any products that contain nicotine or tobacco. This includes cigarettes, e-cigarettes, and chewing tobacco. If you need help quitting, ask your doctor.  Exercise regularly as told by your doctor.  Get enough sleep. This often means 7-9 hours of sleep each night.  Keep all follow-up visits as told by your doctor. This is important. Contact a doctor if:  Your symptoms are not helped by medicine.  You have a headache that feels different than the other headaches.  You feel sick to your stomach (nauseous) or you throw up (vomit).  You have a fever. Get help right away if:  Your headache gets very bad quickly.  Your headache gets worse after a lot of physical activity.  You keep throwing up.  You have a stiff neck.  You have trouble seeing.  You have trouble speaking.  You have pain in the eye or ear.  Your muscles are weak or you lose muscle control.  You lose your balance or have trouble walking.  You feel like you will pass out (faint) or you pass out.  You are mixed up (confused).  You have a seizure. Summary  A headache is pain or discomfort that is felt around the head or neck area.  There are many causes and types of headaches. In some cases, the cause may not be found.  Keep a journal to help find out what causes your headaches. Watch your condition for any changes. Let your doctor know about them.  Contact a doctor if you have a headache that is different from usual, or if your headache is not helped by medicine.  Get help right away if your headache gets very bad, you throw up, you have trouble seeing, you lose your balance, or you have a seizure. This information is not intended to replace advice given to you by your health care provider. Make sure you discuss any questions you have with your health care provider. Document Released: 01/14/2008 Document Revised: 10/25/2017 Document Reviewed: 10/25/2017  Elsevier Patient Education  2020 Elsevier Inc.      Edwina BarthMiguel Stepanie Graver, MD [facility]

## 2018-11-02 NOTE — Patient Instructions (Addendum)
If you have lab work done today you will be contacted with your lab results within the next 2 weeks.  If you have not heard from [place] then please contact us. The fastest way to get your results is to register for My Chart.   IF you received an x-ray today, you will receive an invoice from [facility]. Please contact [facility] at [phone] with questions or concerns regarding your invoice.   IF you received labwork today, you will receive an invoice from [place]. Please contact [facility] at [phone] with questions or concerns regarding your invoice.   Our billing staff will not be able to assist you with questions regarding bills from these companies.  You will be contacted with the lab results as soon as they are available. The fastest way to get your results is to activate your My Chart account. Instructions are located on the last page of this paperwork. If you have not heard from [place] regarding the results in 2 weeks, please contact this office.      General Headache Without Cause A headache is pain or discomfort that is felt around the head or neck area. There are many causes and types of headaches. In some cases, the cause may not be found. Follow these instructions at home: Watch your condition for any changes. Let your doctor know about them. Take these steps to help with your condition: Managing pain      Take over-the-counter and prescription medicines only as told by your doctor.  Lie down in a dark, quiet room when you have a headache.  If told, put ice on your head and neck area: ? Put ice in a plastic bag. ? Place a towel between your skin and the bag. ? Leave the ice on for 20 minutes, 2-3 times per day.  If told, put heat on the affected area. Use the heat source that your doctor recommends, such as a moist heat pack or a heating pad. ? Place a towel between your skin and the heat source. ? Leave the heat on for 20-30 minutes. ? Remove  the heat if your skin turns bright red. This is very important if you are unable to feel pain, heat, or cold. You may have a greater risk of getting burned.  Keep lights dim if bright lights bother you or make your headaches worse. Eating and drinking  Eat meals on a regular schedule.  If you drink alcohol: ? Limit how much you use to:  0-1 drink a day for women.  0-2 drinks a day for men. ? Be aware of how much alcohol is in your drink. In the [place] one drink equals one 12 oz bottle of beer (355 mL), one 5 oz glass of wine (148 mL), or one 1 oz glass of hard liquor (44 mL).  Stop drinking caffeine, or reduce how much caffeine you drink. General instructions   Keep a journal to find out if certain things bring on headaches. For example, write down: ? What you eat and drink. ? How much sleep you get. ? Any change to your diet or medicines.  Get a massage or try other ways to relax.  Limit stress.  Sit up straight. Do not tighten (tense) your muscles.  Do not use any products that contain nicotine or tobacco. This includes cigarettes, e-cigarettes, and chewing tobacco. If you need help quitting, ask your doctor.  Exercise regularly as told by your doctor.  Get enough sleep.  This often means 7-9 hours of sleep each night.  Keep all follow-up visits as told by your doctor. This is important. Contact a doctor if:  Your symptoms are not helped by medicine.  You have a headache that feels different than the other headaches.  You feel sick to your stomach (nauseous) or you throw up (vomit).  You have a fever. Get help right away if:  Your headache gets very bad quickly.  Your headache gets worse after a lot of physical activity.  You keep throwing up.  You have a stiff neck.  You have trouble seeing.  You have trouble speaking.  You have pain in the eye or ear.  Your muscles are weak or you lose muscle control.  You lose your balance or have trouble  walking.  You feel like you will pass out (faint) or you pass out.  You are mixed up (confused).  You have a seizure. Summary  A headache is pain or discomfort that is felt around the head or neck area.  There are many causes and types of headaches. In some cases, the cause may not be found.  Keep a journal to help find out what causes your headaches. Watch your condition for any changes. Let your doctor know about them.  Contact a doctor if you have a headache that is different from usual, or if your headache is not helped by medicine.  Get help right away if your headache gets very bad, you throw up, you have trouble seeing, you lose your balance, or you have a seizure. This information is not intended to replace advice given to you by your health care provider. Make sure you discuss any questions you have with your health care provider. Document Released: 01/14/2008 Document Revised: 10/25/2017 Document Reviewed: 10/25/2017 Elsevier Patient Education  2020 Elsevier Inc.  

## 2018-11-03 ENCOUNTER — Telehealth: Payer: Self-pay | Admitting: Emergency Medicine

## 2018-11-03 ENCOUNTER — Ambulatory Visit: Payer: Federal, State, Local not specified - PPO | Admitting: Family Medicine

## 2018-11-03 LAB — COMPREHENSIVE METABOLIC PANEL
ALT: 21 IU/L (ref 0–32)
AST: 27 IU/L (ref 0–40)
Albumin/Globulin Ratio: 1.7 (ref 1.2–2.2)
Albumin: 4.5 g/dL (ref 3.9–5.0)
Alkaline Phosphatase: 51 IU/L (ref 39–117)
BUN/Creatinine Ratio: 15 (ref 9–23)
BUN: 11 mg/dL (ref 6–20)
Bilirubin Total: 0.3 mg/dL (ref 0.0–1.2)
CO2: 24 mmol/L (ref 20–29)
Calcium: 9.8 mg/dL (ref 8.7–10.2)
Chloride: 97 mmol/L (ref 96–106)
Creatinine, Ser: 0.72 mg/dL (ref 0.57–1.00)
GFR calc Af Amer: 137 mL/min/{1.73_m2} (ref >59)
GFR calc non Af Amer: 118 mL/min/{1.73_m2} (ref >59)
Globulin, Total: 2.6 g/dL (ref 1.5–4.5)
Glucose: 84 mg/dL (ref 65–99)
Potassium: 4.1 mmol/L (ref 3.5–5.2)
Sodium: 138 mmol/L (ref 134–144)
Total Protein: 7.1 g/dL (ref 6.0–8.5)

## 2018-11-03 LAB — CBC WITH DIFFERENTIAL/PLATELET
Basophils Absolute: 0.1 10*3/uL (ref 0.0–0.2)
Basos: 1 %
EOS (ABSOLUTE): 0.1 10*3/uL (ref 0.0–0.4)
Eos: 2 %
Hematocrit: 43.1 % (ref 34.0–46.6)
Hemoglobin: 14.7 g/dL (ref 11.1–15.9)
Immature Grans (Abs): 0 10*3/uL (ref 0.0–0.1)
Immature Granulocytes: 1 %
Lymphocytes Absolute: 1.8 10*3/uL (ref 0.7–3.1)
Lymphs: 27 %
MCH: 28.9 pg (ref 26.6–33.0)
MCHC: 34.1 g/dL (ref 31.5–35.7)
MCV: 85 fL (ref 79–97)
Monocytes Absolute: 0.4 10*3/uL (ref 0.1–0.9)
Monocytes: 5 %
Neutrophils Absolute: 4.3 10*3/uL (ref 1.4–7.0)
Neutrophils: 64 %
Platelets: 233 10*3/uL (ref 150–450)
RBC: 5.09 x10E6/uL (ref 3.77–5.28)
RDW: 12.7 % (ref 11.7–15.4)
WBC: 6.6 10*3/uL (ref 3.4–10.8)

## 2018-11-03 LAB — SEDIMENTATION RATE: Sed Rate: 7 mm/hr (ref 0–32)

## 2018-11-03 NOTE — Telephone Encounter (Signed)
Pt went to pharmacy to get meds for butalbital-aspirin-caffeine [facility]) 50-325-40 MG capsule and was told they didn't have any in stock. Please call pt to discuss

## 2018-11-04 ENCOUNTER — Telehealth: Payer: Self-pay | Admitting: Emergency Medicine

## 2018-11-04 NOTE — Telephone Encounter (Signed)
Copied from [place] [id number]. Topic: General - Other >> Nov 04, 2018  7:53 AM Keene Breath wrote: Reason for CRM: Patient is returning a call regarding her labs.  CB# [phone]

## 2018-11-04 NOTE — Telephone Encounter (Signed)
Pt is checking on status of this call. She would like this med. And a call concerning her lab results she missed yesterday.

## 2018-11-06 NOTE — Telephone Encounter (Signed)
Left message on voicemail to return office call. Dgaddy, CMA 

## 2018-11-14 NOTE — Telephone Encounter (Signed)
Called patient to check if she received the medication and lab results, per patient she got them.

## 2019-02-21 ENCOUNTER — Other Ambulatory Visit: Payer: Self-pay | Admitting: Obstetrics and Gynecology

## 2019-02-21 DIAGNOSIS — Z30015 Encounter for initial prescription of vaginal ring hormonal contraceptive: Secondary | ICD-10-CM

## 2019-02-22 NOTE — Telephone Encounter (Signed)
Message left to return call to [facility] at [phone].   Patient needs to schedule aex for refills of nuvaring.

## 2019-02-24 ENCOUNTER — Other Ambulatory Visit: Payer: Self-pay

## 2019-02-27 ENCOUNTER — Ambulatory Visit: Payer: Federal, State, Local not specified - PPO | Admitting: Obstetrics and Gynecology

## 2019-02-27 ENCOUNTER — Encounter: Payer: Self-pay | Admitting: Obstetrics and Gynecology

## 2019-02-27 NOTE — Progress Notes (Deleted)
23 y.o. G0P0000 Single White or Caucasian Not Hispanic or Latino female here for annual exam.      No LMP recorded.          Sexually active: {yes no:314532}  The current method of family planning is {contraception:315051}.    Exercising: {yes no:314532}  {types:19826} Smoker:  {YES [id number]  Health Maintenance: Pap:  02/14/2018 WNL NEG HPV, 12/09/2016 [facility] History of abnormal Pap:  Yes, 01/13/2017 leep CIN I and II, deep margin and ECC were negative for dysplasia TDaP:  12/20/2015 Gardasil: completed 2 per records    reports that she has been smoking cigarettes. She has been smoking about 0.20 packs per day. She has never used smokeless tobacco. She reports current alcohol use of about 14.0 standard drinks of alcohol per week. She reports that she does not use drugs.  Past Medical History:  Diagnosis Date  . Abnormal Pap smear of cervix    2018 CINII  . Allergy   . Anxiety   . Depression   . Heart murmur   . IBS (irritable bowel syndrome)     Past Surgical History:  Procedure Laterality Date  . COLPOSCOPY     2018  . LEEP     2018  . WRIST SURGERY Left 2014 or 2015    Current Outpatient Medications  Medication Sig Dispense Refill  . butalbital-aspirin-caffeine (FIORINAL) 50-325-40 MG capsule Take 1 capsule by mouth every 6 (six) hours as needed for headache. 14 capsule 0  . cetirizine (ZYRTEC) 10 MG tablet Take 10 mg by mouth daily.    . Clobetasol Propionate (CLOBEX) 0.05 % shampoo Apply 1 application topically daily as needed. (Patient not taking: Reported on 11/10/2017) 118 mL 0  . dicyclomine (BENTYL) 20 MG tablet Take 1 tablet (20 mg total) by mouth 3 (three) times daily as needed. 270 tablet 3  . etonogestrel-ethinyl estradiol (NUVARING) 0.12-0.015 MG/24HR vaginal ring Insert one ring vaginally and leave in place for 3 consecutive weeks, then remove for 1 week. 3 each 3  . fluticasone (FLONASE) 50 MCG/ACT nasal spray Place 2 sprays into both nostrils daily. 16 g 11    No current facility-administered medications for this visit.     Family History  Problem Relation Age of Onset  . Diabetes Mother   . Hyperlipidemia Mother   . Mental illness Mother   . Cancer Father   . Hyperlipidemia Father   . Colon cancer Father 80       mets to his liver.  . Mental illness Sister   . Mental illness Sister     Review of Systems  Exam:   There were no vitals taken for this visit.  Weight change: @WEIGHTCHANGE @ Height:      Ht Readings from Last 3 Encounters:  11/02/18 5\' 3"  (1.6 m)  02/14/18 5' 3.5" (1.613 m)  11/10/17 5' 2.25" (1.581 m)    General appearance: alert, cooperative and appears stated age Head: Normocephalic, without obvious abnormality, atraumatic Neck: no adenopathy, supple, symmetrical, trachea midline and thyroid {CHL AMB PHY EX THYROID NORM [phone]::"normal to inspection and palpation"} Lungs: clear to auscultation bilaterally Cardiovascular: regular rate and rhythm Breasts: {Exam; breast:13139::"normal appearance, no masses or tenderness"} Abdomen: soft, non-tender; non distended,  no masses,  no organomegaly Extremities: extremities normal, atraumatic, no cyanosis or edema Skin: Skin color, texture, turgor normal. No rashes or lesions Lymph nodes: Cervical, supraclavicular, and axillary nodes normal. No abnormal inguinal nodes palpated Neurologic: Grossly normal   Pelvic: External genitalia:  no  lesions              Urethra:  normal appearing urethra with no masses, tenderness or lesions              Bartholins and Skenes: normal                 Vagina: normal appearing vagina with normal color and discharge, no lesions              Cervix: {CHL AMB PHY EX CERVIX NORM DEFAULT:[id number]::"no lesions"}               Bimanual Exam:  Uterus:  {CHL AMB PHY EX UTERUS NORM [phone]::"normal size, contour, position, consistency, mobility, non-tender"}              Adnexa: {CHL AMB PHY EX ADNEXA NO MASS  DEFAULT:[phone]::"no mass, fullness, tenderness"}               Rectovaginal: Confirms               Anus:  normal sphincter tone, no lesions  Chaperone was present for exam.  A:  Well Woman with normal exam  P:

## 2019-04-18 ENCOUNTER — Encounter: Payer: Self-pay | Admitting: Allergy & Immunology

## 2019-04-18 ENCOUNTER — Other Ambulatory Visit: Payer: Self-pay

## 2019-04-18 ENCOUNTER — Ambulatory Visit: Payer: Federal, State, Local not specified - PPO | Admitting: Allergy & Immunology

## 2019-04-18 VITALS — BP 122/88 | HR 84 | Temp 98.3°F | Resp 16 | Ht 62.0 in | Wt 124.0 lb

## 2019-04-18 DIAGNOSIS — J3089 Other allergic rhinitis: Secondary | ICD-10-CM | POA: Diagnosis not present

## 2019-04-18 DIAGNOSIS — J302 Other seasonal allergic rhinitis: Secondary | ICD-10-CM | POA: Diagnosis not present

## 2019-04-18 DIAGNOSIS — L508 Other urticaria: Secondary | ICD-10-CM

## 2019-04-18 MED ORDER — MONTELUKAST SODIUM 10 MG PO TABS: 10.0000 mg | ORAL_TABLET | Freq: Every day | ORAL | 5 refills | Status: DC

## 2019-04-18 MED ORDER — FLUTICASONE PROPIONATE 50 MCG/ACT NA SUSP: 2.0000 | Freq: Every day | NASAL | 5 refills | Status: DC

## 2019-04-18 NOTE — Progress Notes (Signed)
NEW PATIENT  Date of Service/Encounter:  04/18/19  Referring provider: Jolinda Croak, MD   Assessment:   Seasonal and perennial allergic rhinitis (grasses, indoor molds, outdoor molds, dust mites, cat, dog and cockroach)  Chronic allergic urticaria  Plan/Recommendations:   1. Seasonal and perennial allergic rhinitis - Testing today showed: grasses, indoor molds, outdoor molds, dust mites, cat, dog and cockroach - Copy of test results provided.  - Avoidance measures provided. - Continue with: Zyrtec (cetirizine) 41m tablet two times daily - Start taking: Singulair (montelukast) 138mdaily and Flonase (fluticasone) one spray per nostril daily - You can use an extra dose of the antihistamine, if needed, for breakthrough symptoms.  - Consider nasal saline rinses 1-2 times daily to remove allergens from the nasal cavities as well as help with mucous clearance (this is especially helpful to do before the nasal sprays are given) - Consider allergy shots as a means of long-term control. - Allergy shots "re-train" and "reset" the immune system to ignore environmental allergens and decrease the resulting immune response to those allergens (sneezing, itchy watery eyes, runny nose, nasal congestion, etc).    - Allergy shots improve symptoms in 75-85% of patients.  - We can discuss more at the next appointment if the medications are not working for you.  2. Chronic urticaria - Your history does have a "red flag" including joint pains and brusing, which can be concerning for a more serious cause of hives.  - Testing reveals multiple allergic sensitizations that could explain your hives. - We will get some labs to rule out serious causes of hives: complete blood count, tryptase level, chronic urticaria panel, alpha gal panel, ESR, and CRP. - Chronic hives are often times a self limited process and will "burn themselves out" over 6-12 months, although this is not always the case.  - In the  meantime, start suppressive dosing of antihistamines:   - Morning: Zyrtec (cetirizine) 1060mone tablet)  - Evening: Zyrtec (cetirizine) 53m97mne tablet) + Singulair (montelukast) 53mg6mly - You can change this dosing at home, decreasing the dose as needed or increasing the dosing as needed.  - If you are not tolerating the medications or are tired of taking them every day, we can start treatment with a monthly injectable medication called Xolair.   3. Follow up in two months or sooner if needed. This can be an in-person, a virtual Webex or a telephone follow up visit.   Subjective:   Amanda Gutierrez 23 y.62 female presenting today for evaluation of  Chief Complaint  Patient presents with  . Bug bites    having bad swelling, was having terrible reactions, mosquitos are really bad  . Urticaria    hives?  . Allergic Rhinitis     used to do allergy injections, stuffy nose, itchy eyes, ears are okay    Amanda Gutierrez history of the following: Patient Active Problem List   Diagnosis Date Noted  . Anxiety 12/30/2015  . Acne 12/03/2015    History obtained from: chart review and patient.  Amanda Awadallahreferred by ManfrJolinda Croak     Amanda Gutierrez 23 y.64 female presenting for an evaluation of urticaria/bug bites.   Amanda Gutierrez for an evaluation of bug bites.  She reports they have been ongoing since the summer 2020.  She has several pictures today which appear like urticaria.  These are not necessarily associated with bites that she notices, but she notices  the hives instead.  She has had no anaphylactic reactions to these.  She does experience swelling as well as pruritus.  She has tried treating with Benadryl as well as a topical steroid (triamcinolone).  These were provided minimal improvement in symptoms.  Afterwards, there is some bruising noted as well which remains for a few days.  She does have pictures of this.  Once they reach the bruising stage, they  no longer itch.  These can occur over her entire body.  She has one at least every day.  She denies any joint pain associated with these rashes in particular, but she does have a history of bone spurs and scoliosis as well as arthritis.  She denies any history of autoimmunity in the family.  She has never had any blood work performed for this particular problem.  She denies any association with food.  She tolerates all the major food allergens without adverse event.  She does have 2 cats at home which she has had for around 4 years.  All of their flu medications are up-to-date.  There are no bedbugs at all.  She did move to a new house around 3 months ago, but the rash preceded the move to the new house.  She is renting and there is more carpeting in her current.   She does report some conjunctivitis.  She also has a lot of ocular pruritus.  She was on allergy shots for around 1 year when she lived in [place].  She does not remember what she was allergic to.  She has been in [place] for 3 years at this point.  She did have a prescription for Patanol eyedrops.  Now she uses cetirizine and Flonase as needed.   Otherwise, there is no history of other atopic diseases, including drug allergies, stinging insect allergies, eczema or contact dermatitis. There is no significant infectious history. Vaccinations are up to date.    Past Medical History: Patient Active Problem List   Diagnosis Date Noted  . Anxiety 12/30/2015  . Acne 12/03/2015    Medication List:  Allergies as of 04/18/2019      Reactions   Bee Venom Swelling      Medication List       Accurate as of April 18, 2019  3:32 PM. If you have any questions, ask your nurse or doctor.        STOP taking these medications   Clobetasol Propionate 0.05 % shampoo Commonly known as: Clobex Stopped by: Amanda Shaggy, MD     TAKE these medications   butalbital-aspirin-caffeine 50-325-40 MG capsule Commonly known as:  Fiorinal Take 1 capsule by mouth every 6 (six) hours as needed for headache.   cetirizine 10 MG tablet Commonly known as: ZYRTEC Take 10 mg by mouth daily.   dicyclomine 20 MG tablet Commonly known as: BENTYL Take 1 tablet (20 mg total) by mouth 3 (three) times daily as needed.   etonogestrel-ethinyl estradiol 0.12-0.015 MG/24HR vaginal ring Commonly known as: NUVARING Insert one ring vaginally and leave in place for 3 consecutive weeks, then remove for 1 week.   fluticasone 50 MCG/ACT nasal spray Commonly known as: FLONASE Place 2 sprays into both nostrils daily.   montelukast 10 MG tablet Commonly known as: SINGULAIR Take 1 tablet (10 mg total) by mouth at bedtime. Started by: Amanda Shaggy, MD   TRIAMCINOLONE ACETONIDE (TOP) 0.05 % Oint       Birth History: non-contributory  Developmental History: non-contributory  Past  Surgical History: Past Surgical History:  Procedure Laterality Date  . COLPOSCOPY     2018  . LEEP     2018  . WRIST SURGERY Left 2014 or 2015     Family History: Family History  Problem Relation Age of Onset  . Diabetes Mother   . Hyperlipidemia Mother   . Mental illness Mother   . Cancer Father   . Hyperlipidemia Father   . Colon cancer Father 50       mets to his liver.  . Mental illness Sister   . Mental illness Sister      Social History: Amanda Gutierrez lives in a rented house that was built in 1941.  They have been there for approximately 3 months.  There is carpeting throughout the home.  They have electric heating and central cooling.  There are cats inside of the house, 2 of them, which she has had for around 3 or 4 years.  There are no dogs in the home.  She does not have dust mite covers on her bedding.  There is no tobacco exposure.  She works as a Programme researcher, broadcasting/film/video at [employer] and is going to college.  She is aiming to become a physical therapist and has 1 more class to take before she is finished her prerequisites.     Review of Systems  Constitutional: Negative for malaise/fatigue and weight loss.  HENT: Negative.  Negative for congestion, ear discharge, ear pain and sore throat.   Eyes: Negative for pain, discharge and redness.  Respiratory: Negative for cough, sputum production, shortness of breath and wheezing.   Cardiovascular: Negative.  Negative for chest pain and palpitations.  Gastrointestinal: Negative for abdominal pain, constipation, diarrhea, heartburn, nausea and vomiting.  Skin: Negative.  Negative for itching and rash.  Neurological: Negative for dizziness and headaches.  Endo/Heme/Allergies: Negative for environmental allergies. Does not bruise/bleed easily.       Objective:   Blood pressure 122/88, pulse 84, temperature 98.3 F (36.8 C), temperature source Temporal, resp. rate 16, height 5' 2"  (1.575 m), weight 124 lb (56.2 kg), SpO2 98 %. Body mass index is 22.68 kg/m.   Physical Exam:   Physical Exam  Constitutional: She appears well-developed.  Pleasant female. Talkative.   HENT:  Head: Normocephalic and atraumatic.  Right Ear: Tympanic membrane, external ear and ear canal normal.  Left Ear: Tympanic membrane, external ear and ear canal normal.  Nose: Mucosal edema and rhinorrhea present. No nasal deformity or septal deviation. No epistaxis. Right sinus exhibits no maxillary sinus tenderness and no frontal sinus tenderness. Left sinus exhibits no maxillary sinus tenderness and no frontal sinus tenderness.  Mouth/Throat: Uvula is midline and oropharynx is clear and moist. Mucous membranes are not pale and not dry.  There is some discharge noted. Cobblestoning present in the posterior oropharynx.   Eyes: Pupils are equal, round, and reactive to light. Conjunctivae and EOM are normal. Right eye exhibits no chemosis and no discharge. Left eye exhibits no chemosis and no discharge. Right conjunctiva is not injected. Left conjunctiva is not injected.  Cardiovascular: Normal  rate, regular rhythm and normal heart sounds.  Respiratory: Effort normal and breath sounds normal. No accessory muscle usage. No tachypnea. No respiratory distress. She has no wheezes. She has no rhonchi. She has no rales. She exhibits no tenderness.  Moving air well in all lung fields. No crackles or wheezes noted.   Lymphadenopathy:    She has no cervical adenopathy.  Neurological: She is alert.  Skin: No abrasion, no petechiae and no rash noted. Rash is not papular, not vesicular and not urticarial. No erythema. No pallor.  She does have an urticarial lesion on her right shoulder blade today. No dermatographism noted. She does show me some pictures of some bruising noted on some of her hives in the past.   Psychiatric: She has a normal mood and affect.     Diagnostic studies:     Allergy Studies:    Airborne Adult Perc - 04/18/19 1428    Time Antigen Placed  1428    Allergen Manufacturer  Lavella Hammock    Location  Back    Number of Test  59    1. Control-Buffer 50% Glycerol  Negative    2. Control-Histamine 1 mg/ml  2+    3. Albumin saline  Negative    4. [place]  Negative    5. [place]  Negative    6. Johnson  Negative    7. [place] Blue  Negative    8. Meadow Fescue  Negative    9. Perennial Rye  Negative    10. Sweet Vernal  Negative    11. Timothy  Negative    12. Cocklebur  Negative    13. Burweed Marshelder  Negative    14. Ragweed, short  Negative    15. Ragweed, Giant  Negative    16. Plantain,  English  Negative    17. Lamb's Quarters  Negative    18. Sheep Sorrell  Negative    19. Rough Pigweed  Negative    20. Marsh Elder, Rough  Negative    21. Mugwort, Common  Negative    22. Ash mix  Negative    23. Birch mix  Negative    24. Beech American  Negative    25. Box, Elder  Negative    26. Cedar, red  Negative    27. Cottonwood, [place]  Negative    28. Elm mix  Negative    29. Hickory mix  Negative    30. Maple mix  Negative    31. Oak, [place] mix  Negative     32. Pecan Pollen  Negative    33. Pine mix  Negative    34. Sycamore Eastern  Negative    35. Logan, Black Pollen  Negative    36. Alternaria alternata  Negative    37. Cladosporium Herbarum  Negative    38. Aspergillus mix  Negative    39. Penicillium mix  Negative    40. Bipolaris sorokiniana (Helminthosporium)  Negative    41. Drechslera spicifera (Curvularia)  Negative    42. Mucor plumbeus  Negative    43. Fusarium moniliforme  Negative    44. Aureobasidium pullulans (pullulara)  Negative    45. Rhizopus oryzae  Negative    46. Botrytis cinera  Negative    47. Epicoccum nigrum  Negative    48. Phoma betae  Negative    49. Candida Albicans  Negative    50. Trichophyton mentagrophytes  Negative    51. Mite, D Farinae  5,000 AU/ml  4+    52. Mite, D Pteronyssinus  5,000 AU/ml  4+    53. Cat Hair 10,000 BAU/ml  Negative    54.  Dog Epithelia  Negative    55. Mixed Feathers  Negative    56. Horse Epithelia  Negative    57. Cockroach, German  3+    58. Mouse  Negative    59. Tobacco  Leaf  Negative     Intradermal - 04/18/19 1454    Time Antigen Placed  1454    Allergen Manufacturer  Lavella Hammock    Location  Arm    Number of Test  13    Control  Negative    [place]  Negative    Johnson  1+    7 Grass  Negative    Ragweed mix  Negative    Weed mix  Negative    Tree mix  Negative    Mold 1  Negative    Mold 2  Negative    Mold 3  2+    Mold 4  2+    Cat  4+    Dog  2+       Allergy testing results were read and interpreted by myself, documented by clinical staff.         Salvatore Marvel, MD [facility]

## 2019-04-18 NOTE — Patient Instructions (Addendum)
1. Seasonal and perennial allergic rhinitis - Testing today showed: grasses, indoor molds, outdoor molds, dust mites, cat, dog and cockroach - Copy of test results provided.  - Avoidance measures provided. - Continue with: Zyrtec (cetirizine) 41m tablet two times daily - Start taking: Singulair (montelukast) 138mdaily and Flonase (fluticasone) one spray per nostril daily - You can use an extra dose of the antihistamine, if needed, for breakthrough symptoms.  - Consider nasal saline rinses 1-2 times daily to remove allergens from the nasal cavities as well as help with mucous clearance (this is especially helpful to do before the nasal sprays are given) - Consider allergy shots as a means of long-term control. - Allergy shots "re-train" and "reset" the immune system to ignore environmental allergens and decrease the resulting immune response to those allergens (sneezing, itchy watery eyes, runny nose, nasal congestion, etc).    - Allergy shots improve symptoms in 75-85% of patients.  - We can discuss more at the next appointment if the medications are not working for you.  2. Chronic urticaria - Your history does have a "red flag" including joint pains and brusing, which can be concerning for a more serious cause of hives.  - Testing reveals multiple allergic sensitizations that could explain your hives. - We will get some labs to rule out serious causes of hives: complete blood count, tryptase level, chronic urticaria panel, alpha gal panel, ESR, and CRP. - Chronic hives are often times a self limited process and will "burn themselves out" over 6-12 months, although this is not always the case.  - In the meantime, start suppressive dosing of antihistamines:   - Morning: Zyrtec (cetirizine) 1012mone tablet)  - Evening: Zyrtec (cetirizine) 12m54mne tablet) + Singulair (montelukast) 12mg63mly - You can change this dosing at home, decreasing the dose as needed or increasing the dosing as  needed.  - If you are not tolerating the medications or are tired of taking them every day, we can start treatment with a monthly injectable medication called Xolair.   3. No follow-ups on file. This can be an in-person, a virtual Webex or a telephone follow up visit.   Please inform us [facility] visits, hospitalizations, or changes in symptoms. Call us be[place] going to the ED for breathing or allergy symptoms since we might be able to fit you in for a sick visit. Feel free to contact us an[place]ime with any questions, problems, or concerns.  It was a pleasure to meet you today!  Websites that have reliable patient information: 1. American Academy of Asthma, Allergy, and Immunology: www.aaaai.org 2. Food Allergy Research and Education (FARE): foodallergy.org 3. Mothers of Asthmatics: http://www.asthmacommunitynetwork.org 4. American College of Allergy, Asthma, and Immunology: www.acaai.org  "Like" us onK[place]acebook and Instagram for our latest updates!        Make sure you are registered to vote! If you have moved or changed any of your contact information, you will need to get this updated before voting!  In some cases, you MAY be able to register to vote online: [email or web address].itReducing Pollen Exposure  The American Academy of Allergy, Asthma and Immunology suggests the following steps to reduce your exposure to pollen during allergy seasons.    1. Do not hang sheets or clothing out to dry; pollen may collect on these items. 2. Do not mow lawns or spend time around freshly cut grass; mowing stirs up pollen. 3. Keep windows closed at night.  Keep car windows closed while driving. 4. Minimize morning activities outdoors, a time when pollen counts are usually at their highest. 5. Stay indoors as much as possible when pollen counts or humidity is high and on windy days when pollen tends to remain in the air longer. 6. Use air  conditioning when possible.  Many air conditioners have filters that trap the pollen spores. 7. Use a HEPA room air filter to remove pollen form the indoor air you breathe.  Control of Mold Allergen   Mold and fungi can grow on a variety of surfaces provided certain temperature and moisture conditions exist.  Outdoor molds grow on plants, decaying vegetation and soil.  The major outdoor mold, Alternaria and Cladosporium, are found in very high numbers during hot and dry conditions.  Generally, a late Summer - Fall peak is seen for common outdoor fungal spores.  Rain will temporarily lower outdoor mold spore count, but counts rise rapidly when the rainy period ends.  The most important indoor molds are Aspergillus and Penicillium.  Dark, humid and poorly ventilated basements are ideal sites for mold growth.  The next most common sites of mold growth are the bathroom and the kitchen.  Outdoor (Seasonal) Mold Control  Positive outdoor molds via skin testing: Bipolaris (Helminthsporium), Drechslera (Curvalaria) and Mucor  1. Use air conditioning and keep windows closed 2. Avoid exposure to decaying vegetation. 3. Avoid leaf raking. 4. Avoid grain handling. 5. Consider wearing a face mask if working in moldy areas.  6.   Indoor (Perennial) Mold Control   Positive indoor molds via skin testing: Fusarium, Aureobasidium (Pullulara) and Rhizopus  1. Maintain humidity below 50%. 2. Clean washable surfaces with 5% bleach solution. 3. Remove sources e.g. contaminated carpets.     Control of Dog or Cat Allergen  Avoidance is the best way to manage a dog or cat allergy. If you have a dog or cat and are allergic to dog or cats, consider removing the dog or cat from the home. If you have a dog or cat but don't want to find it a new home, or if your family wants a pet even though someone in the household is allergic, here are some strategies that may help keep symptoms at bay:  1. Keep the pet out  of your bedroom and restrict it to only a few rooms. Be advised that keeping the dog or cat in only one room will not limit the allergens to that room. 2. Don't pet, hug or kiss the dog or cat; if you do, wash your hands with soap and water. 3. High-efficiency particulate air (HEPA) cleaners run continuously in a bedroom or living room can reduce allergen levels over time. 4. Regular use of a high-efficiency vacuum cleaner or a central vacuum can reduce allergen levels. 5. Giving your dog or cat a bath at least once a week can reduce airborne allergen.  Control of Dust Mite Allergen    Dust mites play a major role in allergic asthma and rhinitis.  They occur in environments with high humidity wherever human skin is found.  Dust mites absorb humidity from the atmosphere (ie, they do not drink) and feed on organic matter (including shed human and animal skin).  Dust mites are a microscopic type of insect that you cannot see with the naked eye.  High levels of dust mites have been detected from mattresses, pillows, carpets, upholstered furniture, bed covers, clothes, soft toys and any woven material.  The principal allergen  of the dust mite is found in its feces.  A gram of dust may contain 1,000 mites and 250,000 fecal particles.  Mite antigen is easily measured in the air during house cleaning activities.  Dust mites do not bite and do not cause harm to humans, other than by triggering allergies/asthma.    Ways to decrease your exposure to dust mites in your home:  1. Encase mattresses, box springs and pillows with a mite-impermeable barrier or cover   2. Wash sheets, blankets and drapes weekly in hot water (130 F) with detergent and dry them in a dryer on the hot setting.  3. Have the room cleaned frequently with a vacuum cleaner and a damp dust-mop.  For carpeting or rugs, vacuuming with a vacuum cleaner equipped with a high-efficiency particulate air (HEPA) filter.  The dust mite allergic  individual should not be in a room which is being cleaned and should wait 1 hour after cleaning before going into the room. 4. Do not sleep on upholstered furniture (eg, couches).   5. If possible removing carpeting, upholstered furniture and drapery from the home is ideal.  Horizontal blinds should be eliminated in the rooms where the person spends the most time (bedroom, study, television room).  Washable vinyl, roller-type shades are optimal. 6. Remove all non-washable stuffed toys from the bedroom.  Wash stuffed toys weekly like sheets and blankets above.   7. Reduce indoor humidity to less than 50%.  Inexpensive humidity monitors can be purchased at most hardware stores.  Do not use a humidifier as can make the problem worse and are not recommended.  Control of Cockroach Allergen  Cockroach allergen has been identified as an important cause of acute attacks of asthma, especially in urban settings.  There are fifty-five species of cockroach that exist in the [place], however only three, the [place], [place] species produce allergen that can affect patients with Asthma.  Allergens can be obtained from fecal particles, egg casings and secretions from cockroaches.    1. Remove food sources. 2. Reduce access to water. 3. Seal access and entry points. 4. Spray runways with 0.5-1% Diazinon or Chlorpyrifos 5. Blow boric acid power under stoves and refrigerator. 6. Place bait stations (hydramethylnon) at feeding sites.  Allergy Shots   Allergies are the result of a chain reaction that starts in the immune system. Your immune system controls how your body defends itself. For instance, if you have an allergy to pollen, your immune system identifies pollen as an invader or allergen. Your immune system overreacts by producing antibodies called Immunoglobulin E (IgE). These antibodies travel to cells that release chemicals, causing an allergic reaction.  The concept behind allergy  immunotherapy, whether it is received in the form of shots or tablets, is that the immune system can be desensitized to specific allergens that trigger allergy symptoms. Although it requires time and patience, the payback can be long-term relief.  How Do Allergy Shots Work?  Allergy shots work much like a vaccine. Your body responds to injected amounts of a particular allergen given in increasing doses, eventually developing a resistance and tolerance to it. Allergy shots can lead to decreased, minimal or no allergy symptoms.  There generally are two phases: build-up and maintenance. Build-up often ranges from three to six months and involves receiving injections with increasing amounts of the allergens. The shots are typically given once or twice a week, though more rapid build-up schedules are sometimes used.  The maintenance phase begins  when the most effective dose is reached. This dose is different for each person, depending on how allergic you are and your response to the build-up injections. Once the maintenance dose is reached, there are longer periods between injections, typically two to four weeks.  Occasionally doctors give cortisone-type shots that can temporarily reduce allergy symptoms. These types of shots are different and should not be confused with allergy immunotherapy shots.  Who Can Be Treated with Allergy Shots?  Allergy shots may be a good treatment approach for people with allergic rhinitis (hay fever), allergic asthma, conjunctivitis (eye allergy) or stinging insect allergy.   Before deciding to begin allergy shots, you should consider:  . The length of allergy season and the severity of your symptoms . Whether medications and/or changes to your environment can control your symptoms . Your desire to avoid long-term medication use . Time: allergy immunotherapy requires a major time commitment . Cost: may vary depending on your insurance coverage  Allergy shots for  children age 55 and older are effective and often well tolerated. They might prevent the onset of new allergen sensitivities or the progression to asthma.  Allergy shots are not started on patients who are pregnant but can be continued on patients who become pregnant while receiving them. In some patients with other medical conditions or who take certain common medications, allergy shots may be of risk. It is important to mention other medications you talk to your allergist.   When Will I Feel Better?  Some may experience decreased allergy symptoms during the build-up phase. For others, it may take as long as 12 months on the maintenance dose. If there is no improvement after a year of maintenance, your allergist will discuss other treatment options with you.  If you aren't responding to allergy shots, it may be because there is not enough dose of the allergen in your vaccine or there are missing allergens that were not identified during your allergy testing. Other reasons could be that there are high levels of the allergen in your environment or major exposure to non-allergic triggers like tobacco smoke.  What Is the Length of Treatment?  Once the maintenance dose is reached, allergy shots are generally continued for three to five years. The decision to stop should be discussed with your allergist at that time. Some people may experience a permanent reduction of allergy symptoms. Others may relapse and a longer course of allergy shots can be considered.  What Are the Possible Reactions?  The two types of adverse reactions that can occur with allergy shots are local and systemic. Common local reactions include very mild redness and swelling at the injection site, which can happen immediately or several hours after. A systemic reaction, which is less common, affects the entire body or a particular body system. They are usually mild and typically respond quickly to medications. Signs include  increased allergy symptoms such as sneezing, a stuffy nose or hives.  Rarely, a serious systemic reaction called anaphylaxis can develop. Symptoms include swelling in the throat, wheezing, a feeling of tightness in the chest, nausea or dizziness. Most serious systemic reactions develop within 30 minutes of allergy shots. This is why it is strongly recommended you wait in your doctor's office for 30 minutes after your injections. Your allergist is trained to watch for reactions, and his or her staff is trained and equipped with the proper medications to identify and treat them.  Who Should Administer Allergy Shots?  The preferred location for receiving  shots is your prescribing allergist's office. Injections can sometimes be given at another facility where the physician and staff are trained to recognize and treat reactions, and have received instructions by your prescribing allergist.

## 2019-04-19 LAB — CBC WITH DIFFERENTIAL/PLATELET
Basophils Absolute: 0.1 10*3/uL (ref 0.0–0.2)
Basos: 1 %
EOS (ABSOLUTE): 0.1 10*3/uL (ref 0.0–0.4)
Eos: 2 %
Hematocrit: 39.7 % (ref 34.0–46.6)
Hemoglobin: 13.4 g/dL (ref 11.1–15.9)
Immature Grans (Abs): 0 10*3/uL (ref 0.0–0.1)
Immature Granulocytes: 0 %
Lymphocytes Absolute: 2.3 10*3/uL (ref 0.7–3.1)
Lymphs: 31 %
MCH: 27.6 pg (ref 26.6–33.0)
MCHC: 33.8 g/dL (ref 31.5–35.7)
MCV: 82 fL (ref 79–97)
Monocytes Absolute: 0.4 10*3/uL (ref 0.1–0.9)
Monocytes: 5 %
Neutrophils Absolute: 4.5 10*3/uL (ref 1.4–7.0)
Neutrophils: 61 %
Platelets: 204 10*3/uL (ref 150–450)
RBC: 4.85 x10E6/uL (ref 3.77–5.28)
RDW: 12.2 % (ref 11.7–15.4)
WBC: 7.4 10*3/uL (ref 3.4–10.8)

## 2019-04-26 LAB — ALPHA-GAL PANEL
Alpha Gal IgE*: <0.1 kU/L (ref <0.10)
Beef (Bos spp) IgE: <0.1 kU/L (ref <0.35)
Class Interpretation: 0
Class Interpretation: 0
Class Interpretation: 0
Lamb/Mutton (Ovis spp) IgE: <0.1 kU/L (ref <0.35)
Pork (Sus spp) IgE: <0.1 kU/L (ref <0.35)

## 2019-04-26 LAB — THYROID ANTIBODIES
Thyroglobulin Antibody: <1 IU/mL (ref 0.0–0.9)
Thyroperoxidase Ab SerPl-aCnc: 9 IU/mL (ref 0–34)

## 2019-04-26 LAB — C-REACTIVE PROTEIN: CRP: 2 mg/L (ref 0–10)

## 2019-04-26 LAB — TRYPTASE: Tryptase: 5 ug/L (ref 2.2–13.2)

## 2019-04-26 LAB — ANA W/REFLEX IF POSITIVE: Anti Nuclear Antibody (ANA): NEGATIVE

## 2019-04-26 LAB — CHRONIC URTICARIA: cu index: 4.1 (ref <10)

## 2019-04-26 LAB — SEDIMENTATION RATE: Sed Rate: 9 mm/hr (ref 0–32)

## 2019-06-20 ENCOUNTER — Ambulatory Visit: Payer: Federal, State, Local not specified - PPO | Admitting: Allergy & Immunology

## 2019-07-03 ENCOUNTER — Encounter: Payer: Self-pay | Admitting: Certified Nurse Midwife

## 2019-07-05 ENCOUNTER — Encounter: Payer: Self-pay | Admitting: Certified Nurse Midwife

## 2019-07-20 DIAGNOSIS — A749 Chlamydial infection, unspecified: Secondary | ICD-10-CM

## 2019-07-20 HISTORY — DX: Chlamydial infection, unspecified: A74.9

## 2019-07-31 ENCOUNTER — Other Ambulatory Visit: Payer: Self-pay

## 2019-07-31 ENCOUNTER — Ambulatory Visit: Payer: Federal, State, Local not specified - PPO | Admitting: Physician Assistant

## 2019-07-31 NOTE — Progress Notes (Signed)
24 y.o. G0P0000 Single White or Caucasian Not Hispanic or Latino female here for annual exam.   Sexually active, same partner x 1 year. Not using condoms. No complaints.  Period Cycle (Days): 28 Period Duration (Days): 3-4 Period Pattern: Regular Menstrual Flow: Moderate Menstrual Control: Tampon Menstrual Control Change Freq (Hours): 2-3 Dysmenorrhea: (!) Mild Dysmenorrhea Symptoms: Cramping  Patient's last menstrual period was 07/04/2019 (approximate).          Sexually active: Yes.    The current method of family planning is NuvaRing vaginal inserts.    Exercising: Yes.    weight training Smoker:  Yes, trying to quit. Smokes socially  Health Maintenance: Pap:02/14/18 Normal HPV Neg, 12/09/16 [facility] History of abnormal Pap:  Yes,/26/2018 leep CIN I and II, deep margin and ECC were negative for dysplasia  TDaP:  12/20/15  Gardasil: Completed 2    reports that she has been smoking cigarettes. She has a 0.80 pack-year smoking history. She has never used smokeless tobacco. She reports current alcohol use of about 14.0 standard drinks of alcohol per week. She reports that she does not use drugs.Drinks a couple x a week. 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   Past Medical History:  Diagnosis Date  . Abnormal Pap smear of cervix    2018 CINII  . Allergy   . Angio-edema   . Anxiety   . Depression   . Heart murmur   . IBS (irritable bowel syndrome)   . Urticaria     Past Surgical History:  Procedure Laterality Date  . COLPOSCOPY     2018  . LEEP     2018  . WRIST SURGERY Left 2014 or 2015    Current Outpatient Medications  Medication Sig Dispense Refill  . butalbital-aspirin-caffeine (FIORINAL) 50-325-40 MG capsule Take 1 capsule by mouth every 6 (six) hours as needed for headache. 14 capsule 0  . cetirizine (ZYRTEC) 10 MG tablet Take 10 mg by mouth daily.    Marland Kitchen dicyclomine (BENTYL) 20 MG tablet Take 1 tablet (20 mg total) by  mouth 3 (three) times daily as needed. 270 tablet 3  . etonogestrel-ethinyl estradiol (NUVARING) 0.12-0.015 MG/24HR vaginal ring Insert one ring vaginally and leave in place for 3 consecutive weeks, then remove for 1 week. 3 each 3  . fluticasone (FLONASE) 50 MCG/ACT nasal spray Place 2 sprays into both nostrils daily. 16 g 11  . montelukast (SINGULAIR) 10 MG tablet Take 1 tablet (10 mg total) by mouth at bedtime. 30 tablet 5  . TRIAMCINOLONE ACETONIDE, TOP, 0.05 % OINT      No current facility-administered medications for this visit.    Family History  Problem Relation Age of Onset  . Diabetes Mother   . Hyperlipidemia Mother   . Mental illness Mother   . Cancer Father   . Hyperlipidemia Father   . Colon cancer Father 46       mets to his liver.  . Mental illness Sister   . Mental illness Sister     Review of Systems  Constitutional: Negative.   HENT: Positive for sneezing.   Eyes: Negative.   Respiratory: Negative.   Cardiovascular: Negative.   Gastrointestinal: Negative.   Endocrine: Negative.   Genitourinary: Negative.   Musculoskeletal: Negative.   Skin: Negative.   Allergic/Immunologic: Positive for environmental allergies.  Neurological: Negative.   Hematological: Negative.   Psychiatric/Behavioral: Negative.     Exam:   BP  130/80   Pulse 90   Temp 98.7 F (37.1 C)   Ht 5\' 2"  (1.575 m)   Wt 124 lb (56.2 kg)   LMP 07/04/2019 (Approximate)   SpO2 98%   BMI 22.68 kg/m   Weight change: @WEIGHTCHANGE @ Height:   Height: 5\' 2"  (157.5 cm)  Ht Readings from Last 3 Encounters:  08/01/19 5\' 2"  (1.575 m)  04/18/19 5\' 2"  (1.575 m)  11/02/18 5\' 3"  (1.6 m)    General appearance: alert, cooperative and appears stated age Head: Normocephalic, without obvious abnormality, atraumatic Neck: no adenopathy, supple, symmetrical, trachea midline and thyroid normal to inspection and palpation Lungs: clear to auscultation bilaterally Cardiovascular: regular rate and  rhythm Breasts: normal appearance, no masses or tenderness Abdomen: soft, non-tender; non distended,  no masses,  no organomegaly Extremities: extremities normal, atraumatic, no cyanosis or edema Skin: Skin color, texture, turgor normal. No rashes or lesions Lymph nodes: Cervical, supraclavicular, and axillary nodes normal. No abnormal inguinal nodes palpated Neurologic: Grossly normal   Pelvic: External genitalia:  no lesions              Urethra:  normal appearing urethra with no masses, tenderness or lesions              Bartholins and Skenes: normal                 Vagina: normal appearing vagina with slight erythema and slight increase in yellow discharge, no lesions. Tender with digital exam of the vagina.               Cervix: no lesions and evidence of leep, friable with pap               Bimanual Exam:  Uterus:  normal size, contour, position, consistency, mobility, non-tender and anteverted              Adnexa: no mass, fullness, tenderness               Rectovaginal: Confirms               Anus:  normal sphincter tone, no lesions  During BM exam and tenderness, patient stated that she does have discomfort with intercourse. No vaginal d/c or external irritation.  Nuswab sent (done after digital exam, patient aware the gel could limit interpretation)  Dorethea Clan chaperoned for the exam.  A:  Well Woman with normal exam  H/o LEEP   Dyspareunia, some vaginal erythema  Nuvaring, doing well  P:   Pap with hpv, GC/CT/Trich  HIV, RPR  Screening labs UTD  Continue the nuvaring  Nuswab for vaginitis testing  Discussed breast self exam  Discussed calcium and vit D intake  3rd gardasil today

## 2019-08-01 ENCOUNTER — Encounter: Payer: Self-pay | Admitting: Obstetrics and Gynecology

## 2019-08-01 ENCOUNTER — Other Ambulatory Visit (HOSPITAL_COMMUNITY)
Admission: RE | Admit: 2019-08-01 | Discharge: 2019-08-01 | Disposition: A | Discharge status: Home / Self Care | Payer: Federal, State, Local not specified - PPO | Source: Ambulatory Visit | Attending: Obstetrics and Gynecology | Admitting: Obstetrics and Gynecology

## 2019-08-01 ENCOUNTER — Ambulatory Visit (INDEPENDENT_AMBULATORY_CARE_PROVIDER_SITE_OTHER): Payer: Federal, State, Local not specified - PPO | Admitting: Obstetrics and Gynecology

## 2019-08-01 VITALS — BP 130/80 | HR 90 | Temp 98.7°F | Ht 62.0 in | Wt 124.0 lb

## 2019-08-01 DIAGNOSIS — Z3044 Encounter for surveillance of vaginal ring hormonal contraceptive device: Secondary | ICD-10-CM | POA: Diagnosis not present

## 2019-08-01 DIAGNOSIS — Z30015 Encounter for initial prescription of vaginal ring hormonal contraceptive: Secondary | ICD-10-CM

## 2019-08-01 DIAGNOSIS — N941 Unspecified dyspareunia: Secondary | ICD-10-CM

## 2019-08-01 DIAGNOSIS — Z23 Encounter for immunization: Secondary | ICD-10-CM | POA: Diagnosis not present

## 2019-08-01 DIAGNOSIS — Z87410 Personal history of cervical dysplasia: Secondary | ICD-10-CM | POA: Diagnosis not present

## 2019-08-01 DIAGNOSIS — Z01419 Encounter for gynecological examination (general) (routine) without abnormal findings: Secondary | ICD-10-CM

## 2019-08-01 DIAGNOSIS — Z113 Encounter for screening for infections with a predominantly sexual mode of transmission: Secondary | ICD-10-CM

## 2019-08-01 DIAGNOSIS — Z124 Encounter for screening for malignant neoplasm of cervix: Secondary | ICD-10-CM

## 2019-08-01 DIAGNOSIS — Z0000 Encounter for general adult medical examination without abnormal findings: Secondary | ICD-10-CM

## 2019-08-01 MED ORDER — ETONOGESTREL-ETHINYL ESTRADIOL 0.12-0.015 MG/24HR VA RING: VAGINAL_RING | VAGINAL | 3 refills | Status: DC

## 2019-08-01 NOTE — Patient Instructions (Signed)
EXERCISE AND DIET:  We recommended that you start or continue a regular exercise program for good health. Regular exercise means any activity that makes your heart beat faster and makes you sweat.  We recommend exercising at least 30 minutes per day at least 3 days a week, preferably 4 or 5.  We also recommend a diet low in fat and sugar.  Inactivity, poor dietary choices and obesity can cause diabetes, heart attack, stroke, and kidney damage, among others.    ALCOHOL AND SMOKING:  Women should limit their alcohol intake to no more than 7 drinks/beers/glasses of wine (combined, not each!) per week. Moderation of alcohol intake to this level decreases your risk of breast cancer and liver damage. And of course, no recreational drugs are part of a healthy lifestyle.  And absolutely no smoking or even second hand smoke. Most people know smoking can cause heart and lung diseases, but did you know it also contributes to weakening of your bones? Aging of your skin?  Yellowing of your teeth and nails?  CALCIUM AND VITAMIN D:  Adequate intake of calcium and Vitamin D are recommended.  The recommendations for exact amounts of these supplements seem to change often, but generally speaking 1,000 mg of calcium (between diet and supplement) and 800 units of Vitamin D per day seems prudent. Certain women may benefit from higher intake of Vitamin D.  If you are among these women, your doctor will have told you during your visit.    PAP SMEARS:  Pap smears, to check for cervical cancer or precancers,  have traditionally been done yearly, although recent scientific advances have shown that most women can have pap smears less often.  However, every woman still should have a physical exam from her gynecologist every year. It will include a breast check, inspection of the vulva and vagina to check for abnormal growths or skin changes, a visual exam of the cervix, and then an exam to evaluate the size and shape of the uterus and  ovaries.  And after 24 years of age, a rectal exam is indicated to check for rectal cancers. We will also provide age appropriate advice regarding health maintenance, like when you should have certain vaccines, screening for sexually transmitted diseases, bone density testing, colonoscopy, mammograms, etc.   MAMMOGRAMS:  All women over 40 years old should have a yearly mammogram. Many facilities now offer a "3D" mammogram, which may cost around $50 extra out of pocket. If possible,  we recommend you accept the option to have the 3D mammogram performed.  It both reduces the number of women who will be called back for extra views which then turn out to be normal, and it is better than the routine mammogram at detecting truly abnormal areas.    COLON CANCER SCREENING: Now recommend starting at age 45. At this time colonoscopy is not covered for routine screening until 50. There are take home tests that can be done between 45-49.   COLONOSCOPY:  Colonoscopy to screen for colon cancer is recommended for all women at age 50.  We know, you hate the idea of the prep.  We agree, BUT, having colon cancer and not knowing it is worse!!  Colon cancer so often starts as a polyp that can be seen and removed at colonscopy, which can quite literally save your life!  And if your first colonoscopy is normal and you have no family history of colon cancer, most women don't have to have it again for   10 years.  Once every ten years, you can do something that may end up saving your life, right?  We will be happy to help you get it scheduled when you are ready.  Be sure to check your insurance coverage so you understand how much it will cost.  It may be covered as a preventative service at no cost, but you should check your particular policy.      Breast Self-Awareness Breast self-awareness means being familiar with how your breasts look and feel. It involves checking your breasts regularly and reporting any changes to your  health care provider. Practicing breast self-awareness is important. A change in your breasts can be a sign of a serious medical problem. Being familiar with how your breasts look and feel allows you to find any problems early, when treatment is more likely to be successful. All women should practice breast self-awareness, including women who have had breast implants. How to do a breast self-exam One way to learn what is normal for your breasts and whether your breasts are changing is to do a breast self-exam. To do a breast self-exam: Look for Changes  1. Remove all the clothing above your waist. 2. Stand in front of a mirror in a room with good lighting. 3. Put your hands on your hips. 4. Push your hands firmly downward. 5. Compare your breasts in the mirror. Look for differences between them (asymmetry), such as: ? Differences in shape. ? Differences in size. ? Puckers, dips, and bumps in one breast and not the other. 6. Look at each breast for changes in your skin, such as: ? Redness. ? Scaly areas. 7. Look for changes in your nipples, such as: ? Discharge. ? Bleeding. ? Dimpling. ? Redness. ? A change in position. Feel for Changes Carefully feel your breasts for lumps and changes. It is best to do this while lying on your back on the floor and again while sitting or standing in the shower or tub with soapy water on your skin. Feel each breast in the following way:  Place the arm on the side of the breast you are examining above your head.  Feel your breast with the other hand.  Start in the nipple area and make  inch (2 cm) overlapping circles to feel your breast. Use the pads of your three middle fingers to do this. Apply light pressure, then medium pressure, then firm pressure. The light pressure will allow you to feel the tissue closest to the skin. The medium pressure will allow you to feel the tissue that is a little deeper. The firm pressure will allow you to feel the tissue  close to the ribs.  Continue the overlapping circles, moving downward over the breast until you feel your ribs below your breast.  Move one finger-width toward the center of the body. Continue to use the  inch (2 cm) overlapping circles to feel your breast as you move slowly up toward your collarbone.  Continue the up and down exam using all three pressures until you reach your armpit.  Write Down What You Find  Write down what is normal for each breast and any changes that you find. Keep a written record with breast changes or normal findings for each breast. By writing this information down, you do not need to depend only on memory for size, tenderness, or location. Write down where you are in your menstrual cycle, if you are still menstruating. If you are having trouble noticing differences   in your breasts, do not get discouraged. With time you will become more familiar with the variations in your breasts and more comfortable with the exam. How often should I examine my breasts? Examine your breasts every month. If you are breastfeeding, the best time to examine your breasts is after a feeding or after using a breast pump. If you menstruate, the best time to examine your breasts is 5-7 days after your period is over. During your period, your breasts are lumpier, and it may be more difficult to notice changes. When should I see my health care provider? See your health care provider if you notice:  A change in shape or size of your breasts or nipples.  A change in the skin of your breast or nipples, such as a reddened or scaly area.  Unusual discharge from your nipples.  A lump or thick area that was not there before.  Pain in your breasts.  Anything that concerns you.  

## 2019-08-02 LAB — RPR: RPR Ser Ql: NONREACTIVE

## 2019-08-02 LAB — HIV ANTIBODY (ROUTINE TESTING W REFLEX): HIV Screen 4th Generation wRfx: NONREACTIVE

## 2019-08-03 LAB — NUSWAB BV AND CANDIDA, NAA
Candida albicans, NAA: NEGATIVE
Candida glabrata, NAA: NEGATIVE

## 2019-08-04 LAB — CYTOLOGY - PAP
Chlamydia: POSITIVE — AB
Comment: NEGATIVE
Comment: NEGATIVE
Comment: NEGATIVE
Comment: NORMAL
Diagnosis: NEGATIVE
High risk HPV: NEGATIVE
Neisseria Gonorrhea: NEGATIVE
Trichomonas: NEGATIVE

## 2019-08-07 ENCOUNTER — Telehealth: Payer: Self-pay

## 2019-08-07 MED ORDER — AZITHROMYCIN 250 MG PO TABS: ORAL_TABLET | ORAL | 0 refills | Status: DC

## 2019-08-07 NOTE — Telephone Encounter (Signed)
Spoke with patient. Advised of results as seen below. Patient verbalizes understanding. Rx for Azithromycin 1 gram x 1 sent to pharmacy on file. Aware she will need to abstain from intercourse until she and her partner have received treatment and for 1 week following. Will need to use condoms for protection against STD's with intercourse. Health department confidential communicable disease report completed and faxed with results to the [facility] at [phone]. Expedited partner therapy reviewed. Rx for partner to front desk for patient pick up. 3 month recheck appointment scheduled for 11/13/2019 at 4 pm. Patient is agreeable to date and time.  Routing to provider and will close encounter.

## 2019-08-07 NOTE — Telephone Encounter (Signed)
-----   Message from Romualdo Bolk, MD sent at 08/04/2019  9:34 AM EDT ----- Please inform the patient that she has chlamydia and treat with azithromycin 1 gram po x 1. Please offer expedited partner therapy. They should avoid intercourse until one week after they have both been treated, then use condoms. She needs a f/u cervical culture in 3 months. Please let her know that her pap is normal and her hpv is negative! Negative GC/Trich.

## 2019-10-31 DIAGNOSIS — F5104 Psychophysiologic insomnia: Secondary | ICD-10-CM | POA: Insufficient documentation

## 2019-10-31 DIAGNOSIS — F411 Generalized anxiety disorder: Secondary | ICD-10-CM | POA: Insufficient documentation

## 2019-11-08 ENCOUNTER — Telehealth: Payer: Self-pay | Admitting: Obstetrics and Gynecology

## 2019-11-08 NOTE — Telephone Encounter (Signed)
Patient calling to cancel her appointment for 11/13/19. Patient states her mom is having spinal surgery. Patient to call back to reschedule appointment.

## 2019-11-09 ENCOUNTER — Encounter: Payer: Self-pay | Admitting: Obstetrics and Gynecology

## 2019-11-09 NOTE — Telephone Encounter (Signed)
Spoke with patient. Patient due for 3 mo TOC for chlamydia. Patient declined to reschedule at this time. Patient states she will contact the office once her mothers surgery is completed and she knows her availability.   Routing to Dr. Shirley Friar.   Encounter closed.

## 2019-11-13 ENCOUNTER — Ambulatory Visit: Payer: Federal, State, Local not specified - PPO | Admitting: Obstetrics and Gynecology

## 2019-11-23 ENCOUNTER — Other Ambulatory Visit: Payer: Self-pay

## 2019-11-23 ENCOUNTER — Encounter: Payer: Self-pay | Admitting: Physician Assistant

## 2019-11-23 ENCOUNTER — Ambulatory Visit (INDEPENDENT_AMBULATORY_CARE_PROVIDER_SITE_OTHER): Payer: Federal, State, Local not specified - PPO | Admitting: Physician Assistant

## 2019-11-23 DIAGNOSIS — L700 Acne vulgaris: Secondary | ICD-10-CM | POA: Diagnosis not present

## 2019-11-23 NOTE — Progress Notes (Signed)
   Follow up Visit  Subjective  Amanda Gutierrez is a 24 y.o. female who presents for the following: Acne (Not doing much better. tretinoin and amzeeq ). She is using the Tretinoin and amzeeq but she doesn't feel like thes have helped at all. She recently changed from nuvaring to trisprintec OC. Just started first pack.  Objective  Well appearing patient in no apparent distress; mood and affect are within normal limits.  Face, chest, back examined. Relevant physical exam findings are noted in the Assessment and Plan.   Objective  Head - Anterior (Face), Left Upper Back, Right Upper Back: Erythematous papules and pustules with comedones   Assessment & Plan  Acne vulgaris (3) Head - Anterior (Face); Left Upper Back; Right Upper Back  Ordered Medications: Adapalene-Benzoyl Peroxide (EPIDUO FORTE) 0.3-2.5 % GEL minocycline (DYNACIN) 50 MG tablet bid

## 2019-11-25 MED ORDER — MINOCYCLINE HCL 50 MG PO TABS: 50.0000 mg | ORAL_TABLET | Freq: Two times a day (BID) | ORAL | 2 refills | Status: DC

## 2019-11-25 MED ORDER — EPIDUO FORTE 0.3-2.5 % EX GEL: 1.0000 "application " | Freq: Every day | CUTANEOUS | 2 refills | Status: DC

## 2019-11-25 MED ORDER — MINOCYCLINE HCL 100 MG PO TABS: 100.0000 mg | ORAL_TABLET | Freq: Two times a day (BID) | ORAL | 2 refills | Status: DC

## 2019-11-29 MED ORDER — MINOCYCLINE HCL 50 MG PO TABS: 50.0000 mg | ORAL_TABLET | Freq: Two times a day (BID) | ORAL | 2 refills | Status: DC

## 2019-12-04 ENCOUNTER — Other Ambulatory Visit: Payer: Self-pay | Admitting: Physician Assistant

## 2019-12-04 DIAGNOSIS — L700 Acne vulgaris: Secondary | ICD-10-CM

## 2019-12-05 ENCOUNTER — Other Ambulatory Visit: Payer: Self-pay

## 2019-12-05 DIAGNOSIS — L700 Acne vulgaris: Secondary | ICD-10-CM

## 2019-12-05 MED ORDER — ADAPALENE-BENZOYL PEROXIDE 0.1-2.5 % EX GEL: 1.0000 "application " | Freq: Every day | CUTANEOUS | 0 refills | Status: DC

## 2020-02-15 ENCOUNTER — Ambulatory Visit: Payer: Federal, State, Local not specified - PPO | Admitting: Physician Assistant

## 2020-02-22 ENCOUNTER — Telehealth: Payer: Self-pay

## 2020-02-22 NOTE — Telephone Encounter (Signed)
Spoke with patient. Patient is requesting an OV for STD testing and evaluation of bleeding with intercourse x1. Patient was SA with a new partner 3-4 days ago, reports bleeding with intercourse and pain. Bleeding and pain resolved after. No longer on Nuvaring for contraceptive, is currently on Tri-sprintec, prescribed by another provider, has been on this medication "for a few months". Takes pills roughly same time daily, has taken some pills at different times in the day, no missed pills. LMP approximately 2 weeks ago.   Was seen in office for AEX 08/01/19, tx for positive chlamydia on 08/07/19, did not return for TOC.  Denies pelvic pain, fever/chills, N/V, vag d/c, odor, urinary symptoms.   OV scheduled for 11/8 at 2:45pm w/ Dr. Oscar La. Patient is aware this is a work in appt. Advised I will update Dr. Oscar La, our office will f/u if any additional recommendations. Patient verbalizes understanding and is agreeable.   Routing to provider for final review.

## 2020-02-22 NOTE — Telephone Encounter (Signed)
Patient is calling in regards to heavy bleeding with sex. Patient would also like to have STD testing done.

## 2020-02-26 ENCOUNTER — Encounter: Payer: Self-pay | Admitting: Obstetrics and Gynecology

## 2020-02-26 ENCOUNTER — Other Ambulatory Visit: Payer: Self-pay

## 2020-02-26 ENCOUNTER — Ambulatory Visit (INDEPENDENT_AMBULATORY_CARE_PROVIDER_SITE_OTHER): Payer: Federal, State, Local not specified - PPO | Admitting: Obstetrics and Gynecology

## 2020-02-26 VITALS — BP 116/70 | HR 76 | Resp 16 | Ht 62.0 in | Wt 128.0 lb

## 2020-02-26 DIAGNOSIS — N930 Postcoital and contact bleeding: Secondary | ICD-10-CM

## 2020-02-26 DIAGNOSIS — N949 Unspecified condition associated with female genital organs and menstrual cycle: Secondary | ICD-10-CM

## 2020-02-26 DIAGNOSIS — N888 Other specified noninflammatory disorders of cervix uteri: Secondary | ICD-10-CM

## 2020-02-26 DIAGNOSIS — Z8619 Personal history of other infectious and parasitic diseases: Secondary | ICD-10-CM

## 2020-02-26 DIAGNOSIS — Z113 Encounter for screening for infections with a predominantly sexual mode of transmission: Secondary | ICD-10-CM | POA: Diagnosis not present

## 2020-02-26 DIAGNOSIS — R102 Pelvic and perineal pain: Secondary | ICD-10-CM

## 2020-02-26 NOTE — Progress Notes (Signed)
GYNECOLOGY  VISIT   HPI: 24 y.o.   Single White or Caucasian Not Hispanic or Latino  female   G0P0000 with Patient's last menstrual period was 02/02/2020 (within days).   here for TOC chlamydia and bleeding with intercourse.  She wasn't sexually active for 3 months, new partner, bleed both times. Large amount of blood, light bleeding until the next day.  Patient also states that she occasionally has lower abdominal pressure x2 months.   She feels like being on the pill worsens her IBS. No constipation, just more sensitive.  She went on the pill to help with her acne and contraception. The ring didn't help her acne as much as the pill.   GYNECOLOGIC HISTORY: Patient's last menstrual period was 02/02/2020 (within days). Contraception:Tri-Sprintec Menopausal hormone therapy: none        OB History    Gravida  0   Para  0   Term  0   Preterm  0   AB  0   Living  0     SAB  0   TAB  0   Ectopic  0   Multiple  0   Live Births  0              Patient Active Problem List   Diagnosis Date Noted  . Anxiety 12/30/2015  . Acne 12/03/2015    Past Medical History:  Diagnosis Date  . Abnormal Pap smear of cervix    2018 CINII  . Allergy   . Angio-edema   . Anxiety   . Chlamydia 07/2019  . Depression   . Heart murmur   . IBS (irritable bowel syndrome)   . Urticaria     Past Surgical History:  Procedure Laterality Date  . COLPOSCOPY     2018  . LEEP     2018  . WRIST SURGERY Left 2014 or 2015    Current Outpatient Medications  Medication Sig Dispense Refill  . Adapalene-Benzoyl Peroxide (EPIDUO) 0.1-2.5 % gel Apply 1 application topically at bedtime. 45 g 0  . busPIRone (BUSPAR) 10 MG tablet Take 10 mg by mouth 2 (two) times daily.    . cetirizine (ZYRTEC) 10 MG tablet Take 10 mg by mouth daily.    . fluticasone (FLONASE) 50 MCG/ACT nasal spray Place 2 sprays into both nostrils daily. 16 g 11  . TRI-SPRINTEC 0.18/0.215/0.25 MG-35 MCG tablet Take 1 tablet  by mouth daily.    . TRIAMCINOLONE ACETONIDE, TOP, 0.05 % OINT     . butalbital-aspirin-caffeine (FIORINAL) 50-325-40 MG capsule Take 1 capsule by mouth every 6 (six) hours as needed for headache. (Patient not taking: Reported on 02/26/2020) 14 capsule 0   No current facility-administered medications for this visit.     ALLERGIES: Bee venom  Family History  Problem Relation Age of Onset  . Diabetes Mother   . Hyperlipidemia Mother   . Mental illness Mother   . Cancer Father   . Hyperlipidemia Father   . Colon cancer Father 46       mets to his liver.  . Mental illness Sister   . Mental illness Sister     Social History   Socioeconomic History  . Marital status: Single    Spouse name: Not on file  . Number of children: Not on file  . Years of education: Not on file  . Highest education level: Not on file  Occupational History  . Not on file  Tobacco Use  . Smoking status:  Current Some Day Smoker    Packs/day: 0.20    Years: 4.00    Pack years: 0.80    Types: Cigarettes  . Smokeless tobacco: Never Used  . Tobacco comment: smokes every now and then  Vaping Use  . Vaping Use: Never used  Substance and Sexual Activity  . Alcohol use: Yes    Alcohol/week: 14.0 standard drinks    Types: 14 Glasses of wine per week  . Drug use: No  . Sexual activity: Yes    Partners: Male    Birth control/protection: Pill    Comment: Tri-Sprintec  Other Topics Concern  . Not on file  Social History Narrative  . Not on file   Social Determinants of Health   Financial Resource Strain:   . Difficulty of Paying Living Expenses: Not on file  Food Insecurity:   . Worried About Programme researcher, broadcasting/film/video in the Last Year: Not on file  . Ran Out of Food in the Last Year: Not on file  Transportation Needs:   . Lack of Transportation (Medical): Not on file  . Lack of Transportation (Non-Medical): Not on file  Physical Activity:   . Days of Exercise per Week: Not on file  . Minutes of  Exercise per Session: Not on file  Stress:   . Feeling of Stress : Not on file  Social Connections:   . Frequency of Communication with Friends and Family: Not on file  . Frequency of Social Gatherings with Friends and Family: Not on file  . Attends Religious Services: Not on file  . Active Member of Clubs or Organizations: Not on file  . Attends Banker Meetings: Not on file  . Marital Status: Not on file  Intimate Partner Violence:   . Fear of Current or Ex-Partner: Not on file  . Emotionally Abused: Not on file  . Physically Abused: Not on file  . Sexually Abused: Not on file    Review of Systems  Constitutional: Negative.   HENT: Negative.   Eyes: Negative.   Respiratory: Negative.   Cardiovascular: Negative.   Gastrointestinal: Negative.   Genitourinary:       Bleeding with intercourse  Musculoskeletal: Negative.   Skin: Negative.   Neurological: Negative.   Endo/Heme/Allergies: Negative.   Psychiatric/Behavioral: Negative.     PHYSICAL EXAMINATION:    BP 116/70 (BP Location: Right Arm, Patient Position: Sitting, Cuff Size: Normal)   Pulse 76   Resp 16   Ht 5\' 2"  (1.575 m)   Wt 128 lb (58.1 kg)   LMP 02/02/2020 (Within Days)   BMI 23.41 kg/m     General appearance: alert, cooperative and appears stated age   Pelvic: External genitalia:  no lesions              Urethra:  normal appearing urethra with no masses, tenderness or lesions              Bartholins and Skenes: normal                 Vagina: normal appearing vagina with normal color and discharge, no lesions              Cervix: no cervical motion tenderness, no lesions and evidence of leep, appears mildly friable              Bimanual Exam:  Uterus:  normal size, contour, position, consistency, mobility, non-tender and anteverted  Adnexa: fullness and tenderness in the left adnexa              Pelvic floor: mildly tender on the left.  Chaperone was present for  exam.  ASSESSMENT H/O chlamydia Postcoital bleeding, h/o leep, +ectropion, doesn't appear infected Pelvic pain Left adnexal fullness and tenderness     PLAN STD testing Return for pelvic ultrasound Discussed postcoital bleeding, reassured doesn't have any concerning findings on exam in regards to her cervix.

## 2020-02-27 ENCOUNTER — Encounter: Payer: Self-pay | Admitting: Obstetrics and Gynecology

## 2020-02-27 LAB — HIV ANTIBODY (ROUTINE TESTING W REFLEX): HIV Screen 4th Generation wRfx: NONREACTIVE

## 2020-02-27 LAB — RPR: RPR Ser Ql: NONREACTIVE

## 2020-02-29 ENCOUNTER — Telehealth: Payer: Self-pay

## 2020-02-29 LAB — CHLAMYDIA/GONOCOCCUS/TRICHOMONAS, NAA
Chlamydia by NAA: NEGATIVE
Gonococcus by NAA: NEGATIVE
Trich vag by NAA: NEGATIVE

## 2020-02-29 NOTE — Telephone Encounter (Signed)
Call to patient. Per DPR, OK to leave message on voicemail.   Left voicemail requesting a return call to Rufus Beske to review benefits and schedule recommended Pelvic ultrasound with Jill Jertson, MD 

## 2020-03-04 NOTE — Telephone Encounter (Signed)
Call to patient. Per DPR, OK to leave message on voicemail.   Left voicemail requesting a return call to Amanda Gutierrez to review benefits and schedule recommended Pelvic ultrasound with Jill Jertson, MD 

## 2020-03-13 NOTE — Telephone Encounter (Signed)
Patient wants to speak with the nurse regarding testing.

## 2020-03-13 NOTE — Telephone Encounter (Signed)
Patient states starting a new job and they are requesting she have the COVID vaccine. Patient doesn't want to take it. She wants documentation for exemption. Advised patient to discuss with her PCP.  Routed to Provider

## 2020-04-23 ENCOUNTER — Ambulatory Visit: Payer: Federal, State, Local not specified - PPO | Admitting: Dermatology

## 2020-07-09 ENCOUNTER — Ambulatory Visit: Payer: Federal, State, Local not specified - PPO | Admitting: Dermatology

## 2020-09-12 ENCOUNTER — Ambulatory Visit (INDEPENDENT_AMBULATORY_CARE_PROVIDER_SITE_OTHER): Payer: Federal, State, Local not specified - PPO | Admitting: Obstetrics and Gynecology

## 2020-09-12 ENCOUNTER — Other Ambulatory Visit: Payer: Self-pay

## 2020-09-12 ENCOUNTER — Encounter: Payer: Self-pay | Admitting: Obstetrics and Gynecology

## 2020-09-12 VITALS — BP 132/70 | HR 92 | Ht 62.0 in | Wt 136.0 lb

## 2020-09-12 DIAGNOSIS — Z3041 Encounter for surveillance of contraceptive pills: Secondary | ICD-10-CM | POA: Diagnosis not present

## 2020-09-12 DIAGNOSIS — Z113 Encounter for screening for infections with a predominantly sexual mode of transmission: Secondary | ICD-10-CM | POA: Diagnosis not present

## 2020-09-12 DIAGNOSIS — Z833 Family history of diabetes mellitus: Secondary | ICD-10-CM

## 2020-09-12 DIAGNOSIS — Z01419 Encounter for gynecological examination (general) (routine) without abnormal findings: Secondary | ICD-10-CM | POA: Diagnosis not present

## 2020-09-12 DIAGNOSIS — Z0000 Encounter for general adult medical examination without abnormal findings: Secondary | ICD-10-CM | POA: Diagnosis not present

## 2020-09-12 DIAGNOSIS — Z800 Family history of malignant neoplasm of digestive organs: Secondary | ICD-10-CM

## 2020-09-12 NOTE — Patient Instructions (Signed)

## 2020-09-12 NOTE — Progress Notes (Signed)
25 y.o. G0P0000 Single White or Caucasian Not Hispanic or Latino female here for annual exam.   Period Cycle (Days): 35 Period Duration (Days): 4 Period Pattern: Regular Menstrual Flow: Moderate Menstrual Control: Tampon Menstrual Control Change Freq (Hours): 2 Dysmenorrhea: (!) Moderate Dysmenorrhea Symptoms: Cramping  She is wearing a super tampon, thinks she would saturate it in 4-5 hours. Changes it q 2 hours. Cramps are tolerable.  Sexually active, same partner x 3 months, not using condoms.   Patient's last menstrual period was 08/19/2020 (approximate).          Sexually active: Yes.    The current method of family planning is oral contraceptive.    Exercising: No.  The patient does not participate in regular exercise at present. Smoker:  Vaping   Health Maintenance: Pap: 08/01/2019 WNL Hr hpv neg  02/14/18 Normal HPV Neg, 12/09/16 [facility] History of abnormal Pap:  Yes. H/O leep in 2018, CIN I and II MMG:  None  BMD:   None  Colonoscopy: none  TDaP:  12/20/2015 Gardasil: complete    reports that she has been smoking cigarettes. She has a 0.80 pack-year smoking history. She has never used smokeless tobacco. She reports current alcohol use of about 14.0 standard drinks of alcohol per week. She reports that she does not use drugs. Drinking 3-4 days a week, will have 2-8 drinks a night. 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   Past Medical History:  Diagnosis Date  . Abnormal Pap smear of cervix    2018 CINII  . Allergy   . Angio-edema   . Anxiety   . Chlamydia 07/2019  . Depression   . Heart murmur   . IBS (irritable bowel syndrome)   . Urticaria     Past Surgical History:  Procedure Laterality Date  . COLPOSCOPY     2018  . LEEP     2018  . WRIST SURGERY Left 2014 or 2015    Current Outpatient Medications  Medication Sig Dispense Refill  . Adapalene-Benzoyl Peroxide (EPIDUO) 0.1-2.5 % gel Apply 1 application topically at bedtime. 45  g 0  . busPIRone (BUSPAR) 10 MG tablet Take 10 mg by mouth 2 (two) times daily.    . cetirizine (ZYRTEC) 10 MG tablet Take 10 mg by mouth daily.    . fluticasone (FLONASE) 50 MCG/ACT nasal spray Place 2 sprays into both nostrils daily. 16 g 11  . TRI-SPRINTEC 0.18/0.215/0.25 MG-35 MCG tablet Take 1 tablet by mouth daily.     No current facility-administered medications for this visit.    Family History  Problem Relation Age of Onset  . Diabetes Mother   . Hyperlipidemia Mother   . Mental illness Mother   . Cancer Father   . Hyperlipidemia Father   . Colon cancer Father 46       mets to his liver.  . Mental illness Sister   . Mental illness Sister   Dad with colon cancer in his late 48's  Review of Systems  All other systems reviewed and are negative.   Exam:   BP 132/70   Pulse 92   Ht 5\' 2"  (1.575 m)   Wt 136 lb (61.7 kg)   LMP 08/19/2020 (Approximate)   SpO2 98%   BMI 24.87 kg/m   Weight change: @WEIGHTCHANGE @ Height:   Height: 5\' 2"  (157.5 cm)  Ht Readings from Last 3 Encounters:  09/12/20 5\' 2"  (1.575 m)  02/26/20 5\' 2"  (1.575 m)  08/01/19 5\' 2"  (1.575 m)    General appearance: alert, cooperative and appears stated age Head: Normocephalic, without obvious abnormality, atraumatic Neck: no adenopathy, supple, symmetrical, trachea midline and thyroid normal to inspection and palpation Lungs: clear to auscultation bilaterally Cardiovascular: regular rate and rhythm Breasts: normal appearance, no masses or tenderness Abdomen: soft, non-tender; non distended,  no masses,  no organomegaly Extremities: extremities normal, atraumatic, no cyanosis or edema Skin: Skin color, texture, turgor normal. No rashes or lesions Lymph nodes: Cervical, supraclavicular, and axillary nodes normal. No abnormal inguinal nodes palpated Neurologic: Grossly normal   Pelvic: External genitalia:  no lesions              Urethra:  normal appearing urethra with no masses, tenderness or  lesions              Bartholins and Skenes: normal                 Vagina: normal appearing vagina with normal color and discharge, no lesions              Cervix: no lesions               Bimanual Exam:  Uterus:  normal size, contour, position, consistency, mobility, non-tender              Adnexa: no mass, fullness, tenderness               Rectovaginal: Confirms               Anus:  normal sphincter tone, no lesions   1. Well woman exam Discussed breast self exam Discussed calcium and vit D intake No pap this year  2. Screening examination for STD (sexually transmitted disease) - Hepatitis C antibody - HIV Antibody (routine testing w rflx) - RPR - SURESWAB CT/NG/T. vaginalis  3. Encounter for surveillance of contraceptive pills She is doing well. She is getting her pills through an on line pharmacy (they have MD's)  4. Laboratory exam ordered as part of routine general medical examination - CBC - Comprehensive metabolic panel  5. Family history of diabetes mellitus (DM) - Hemoglobin A1c  6. Family history of colon cancer Discussed that she will need colon cancer screening 10 years prior to her Dad's diagnosis.

## 2020-09-13 LAB — COMPREHENSIVE METABOLIC PANEL
AG Ratio: 1.4 (calc) (ref 1.0–2.5)
ALT: 10 U/L (ref 6–29)
AST: 15 U/L (ref 10–30)
Albumin: 4.2 g/dL (ref 3.6–5.1)
Alkaline phosphatase (APISO): 38 U/L (ref 31–125)
BUN: 13 mg/dL (ref 7–25)
CO2: 26 mmol/L (ref 20–32)
Calcium: 9.7 mg/dL (ref 8.6–10.2)
Chloride: 102 mmol/L (ref 98–110)
Creat: 0.75 mg/dL (ref 0.50–1.10)
Globulin: 2.9 g/dL (calc) (ref 1.9–3.7)
Glucose, Bld: 77 mg/dL (ref 65–99)
Potassium: 4.1 mmol/L (ref 3.5–5.3)
Sodium: 138 mmol/L (ref 135–146)
Total Bilirubin: 0.3 mg/dL (ref 0.2–1.2)
Total Protein: 7.1 g/dL (ref 6.1–8.1)

## 2020-09-13 LAB — HIV ANTIBODY (ROUTINE TESTING W REFLEX): HIV 1&2 Ab, 4th Generation: NONREACTIVE

## 2020-09-13 LAB — HEPATITIS C ANTIBODY
Hepatitis C Ab: NONREACTIVE
SIGNAL TO CUT-OFF: 0.01 (ref <1.00)

## 2020-09-13 LAB — CBC
HCT: 39.9 % (ref 35.0–45.0)
Hemoglobin: 12.9 g/dL (ref 11.7–15.5)
MCH: 27.2 pg (ref 27.0–33.0)
MCHC: 32.3 g/dL (ref 32.0–36.0)
MCV: 84.2 fL (ref 80.0–100.0)
MPV: 11.2 fL (ref 7.5–12.5)
Platelets: 254 10*3/uL (ref 140–400)
RBC: 4.74 10*6/uL (ref 3.80–5.10)
RDW: 12.3 % (ref 11.0–15.0)
WBC: 6.3 10*3/uL (ref 3.8–10.8)

## 2020-09-13 LAB — SURESWAB CT/NG/T. VAGINALIS
C. trachomatis RNA, TMA: NOT DETECTED
N. gonorrhoeae RNA, TMA: NOT DETECTED
Trichomonas vaginalis RNA: NOT DETECTED

## 2020-09-13 LAB — RPR: RPR Ser Ql: NONREACTIVE

## 2020-09-13 LAB — HEMOGLOBIN A1C
Hgb A1c MFr Bld: 4.9 % of total Hgb (ref <5.7)
Mean Plasma Glucose: 94 mg/dL
eAG (mmol/L): 5.2 mmol/L

## 2020-12-30 ENCOUNTER — Ambulatory Visit: Payer: Federal, State, Local not specified - PPO | Admitting: Nurse Practitioner

## 2021-01-22 ENCOUNTER — Ambulatory Visit: Payer: Federal, State, Local not specified - PPO | Admitting: Nurse Practitioner

## 2021-07-02 DIAGNOSIS — G4730 Sleep apnea, unspecified: Secondary | ICD-10-CM | POA: Insufficient documentation

## 2021-09-16 NOTE — Progress Notes (Signed)
26 y.o. G0P0000 Single White or Caucasian Not Hispanic or Latino female here for annual exam.  She has had a couple of months of breakthrough bleeding.  Period Cycle (Days): 28 Period Duration (Days): 4 Period Pattern: Regular Menstrual Flow: Moderate Menstrual Control: Tampon Menstrual Control Change Freq (Hours): 2-3 Dysmenorrhea: (!) Mild Dysmenorrhea Symptoms: Cramping (she had had more pain on her left side during her periods in recent months) The pain has been coming intermittently with her last 5-6 cycles. The pain is throbbing, lasts for a couple of minutes, up to a 5/10 in severity. Not associated with BM's.  She has mild menstrual like cramps as well, but different than the LLQ pain.  She has normal BM qd.  Sexually active, same partner for over 1 year. Occasional entry or deep dyspareunia. Not using lubricant. If he is too deep she can bleed. The deep pain is positional.   Patient's last menstrual period was 09/21/2021.          Sexually active: Yes.    The current method of family planning is OCP (estrogen/progesterone).    Exercising: No.  The patient does not participate in regular exercise at present. Smoker:  yes vaping, she quit cigarettes. Much less vaping than smoking.    Health Maintenance: Pap:    08/01/2019 WNL Hr hpv neg  02/14/18 Normal HPV Neg, 12/09/16 [facility] History of abnormal Pap:  Yes. H/O leep in 2018, CIN I and II MMG:  none  BMD:   none  Colonoscopy: none  TDaP:  12/20/2015  Gardasil: complete    reports that she has quit smoking. Her smoking use included cigarettes. She has a 0.80 pack-year smoking history. She has never used smokeless tobacco. She reports that she does not currently use alcohol. She reports that she does not use drugs. She has cut back on ETOH, ranges from 4-10 drinks a week. 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   Past Medical History:  Diagnosis Date   Abnormal Pap smear of cervix     2018 [facility]   Allergy    Angio-edema    Anxiety    Chlamydia 07/2019   Depression    Heart murmur    IBS (irritable bowel syndrome)    Urticaria     Past Surgical History:  Procedure Laterality Date   COLPOSCOPY     2018   LEEP     2018   WRIST SURGERY Left 2014 or 2015    Current Outpatient Medications  Medication Sig Dispense Refill   Adapalene-Benzoyl Peroxide (EPIDUO) 0.1-2.5 % gel Apply 1 application topically at bedtime. 45 g 0   busPIRone (BUSPAR) 15 MG tablet Take by mouth.     cetirizine (ZYRTEC) 10 MG tablet Take 10 mg by mouth daily.     fluticasone (FLONASE) 50 MCG/ACT nasal spray Place 2 sprays into both nostrils daily. 16 g 11   hydrOXYzine (ATARAX) 25 MG tablet Take by mouth.     LORYNA 3-0.02 MG tablet Take 1 tablet by mouth daily.     No current facility-administered medications for this visit.    Family History  Problem Relation Age of Onset   Breast cancer Mother 66   Diabetes Mother    Hyperlipidemia Mother    Mental illness Mother    Cancer Father    Hyperlipidemia Father    Colon cancer Father 22       mets to his liver, died in 2023/02/16 at  51   Mental illness Sister    Mental illness Sister   Mom diagnosed with breast cancer at 1. Just had a mastectomy yesterday.  Father died in 2023/02/18  Review of Systems  Genitourinary:  Positive for menstrual problem.  All other systems reviewed and are negative.  Exam:   BP 118/82   Pulse 98   Ht 5\' 2"  (1.575 m)   Wt 130 lb (59 kg)   LMP 09/21/2021   SpO2 100%   BMI 23.78 kg/m   Weight change: @WEIGHTCHANGE @ Height:   Height: 5\' 2"  (157.5 cm)  Ht Readings from Last 3 Encounters:  09/24/21 5\' 2"  (1.575 m)  09/12/20 5\' 2"  (1.575 m)  02/26/20 5\' 2"  (1.575 m)    General appearance: alert, cooperative and appears stated age Head: Normocephalic, without obvious abnormality, atraumatic Neck: no adenopathy, supple, symmetrical, trachea midline and thyroid normal to inspection and palpation Lungs:  clear to auscultation bilaterally Cardiovascular: regular rate and rhythm Breasts: normal appearance, no masses or tenderness Abdomen: soft, non-tender; non distended,  no masses,  no organomegaly Extremities: extremities normal, atraumatic, no cyanosis or edema Skin: Skin color, texture, turgor normal. No rashes or lesions Lymph nodes: Cervical, supraclavicular, and axillary nodes normal. No abnormal inguinal nodes palpated Neurologic: Grossly normal   Pelvic: External genitalia:  no lesions              Urethra:  normal appearing urethra with no masses, tenderness or lesions              Bartholins and Skenes: normal                 Vagina: normal appearing vagina with normal color and discharge, no lesions              Cervix: no lesions               Bimanual Exam:  Uterus:  normal size, contour, position, consistency, mobility, non-tender              Adnexa: no mass, fullness, tenderness               Rectovaginal: Confirms               Anus:  normal sphincter tone, no lesions Pelvic floor: not tender  11/24/21, CMA chaperoned for the exam.  1. Well woman exam Discussed breast self exam Discussed calcium and vit D intake Labs up to date Pap due next year  2. Screening examination for STD (sexually transmitted disease) - RPR - HIV Antibody (routine testing w rflx) - Hepatitis C antibody - SURESWAB CT/NG/T. vaginalis  3. Combined abdominal and pelvic pain No findings on exam. Pain in the LLQ is with every cycle for the last 5-6 months - PELVIS TRANSVAGINAL NON-OB (TV ONLY); Future  4. Encounter for surveillance of contraceptive pills We discussed the option of trying the loseasonique to try and suppress her cycles. If loseasonique isn't covered we could consider Yaz (on trisprintec for acne), discussed possible increased risk of clots of yaz.  She will send a mychart message to let me know which pill she would like.

## 2021-09-22 ENCOUNTER — Ambulatory Visit: Payer: Federal, State, Local not specified - PPO | Admitting: Obstetrics and Gynecology

## 2021-09-24 ENCOUNTER — Encounter: Payer: Self-pay | Admitting: Obstetrics and Gynecology

## 2021-09-24 ENCOUNTER — Ambulatory Visit (INDEPENDENT_AMBULATORY_CARE_PROVIDER_SITE_OTHER): Payer: 59 | Admitting: Obstetrics and Gynecology

## 2021-09-24 VITALS — BP 118/82 | HR 98 | Ht 62.0 in | Wt 130.0 lb

## 2021-09-24 DIAGNOSIS — Z01419 Encounter for gynecological examination (general) (routine) without abnormal findings: Secondary | ICD-10-CM | POA: Diagnosis not present

## 2021-09-24 DIAGNOSIS — Z803 Family history of malignant neoplasm of breast: Secondary | ICD-10-CM

## 2021-09-24 DIAGNOSIS — Z3041 Encounter for surveillance of contraceptive pills: Secondary | ICD-10-CM | POA: Diagnosis not present

## 2021-09-24 DIAGNOSIS — R109 Unspecified abdominal pain: Secondary | ICD-10-CM | POA: Diagnosis not present

## 2021-09-24 DIAGNOSIS — Z113 Encounter for screening for infections with a predominantly sexual mode of transmission: Secondary | ICD-10-CM

## 2021-09-24 DIAGNOSIS — Z800 Family history of malignant neoplasm of digestive organs: Secondary | ICD-10-CM

## 2021-09-24 DIAGNOSIS — R102 Pelvic and perineal pain: Secondary | ICD-10-CM

## 2021-09-24 NOTE — Patient Instructions (Signed)

## 2021-09-25 LAB — SURESWAB CT/NG/T. VAGINALIS
C. trachomatis RNA, TMA: DETECTED — AB
N. gonorrhoeae RNA, TMA: NOT DETECTED
Trichomonas vaginalis RNA: NOT DETECTED

## 2021-09-25 LAB — HEPATITIS C ANTIBODY
Hepatitis C Ab: NONREACTIVE
SIGNAL TO CUT-OFF: 0.14 (ref <1.00)

## 2021-09-25 LAB — RPR: RPR Ser Ql: NONREACTIVE

## 2021-09-25 LAB — HIV ANTIBODY (ROUTINE TESTING W REFLEX): HIV 1&2 Ab, 4th Generation: NONREACTIVE

## 2021-09-29 ENCOUNTER — Telehealth: Payer: Self-pay

## 2021-09-29 ENCOUNTER — Other Ambulatory Visit: Payer: Self-pay

## 2021-09-29 MED ORDER — DOXYCYCLINE MONOHYDRATE 100 MG PO CAPS: 100.0000 mg | ORAL_CAPSULE | Freq: Two times a day (BID) | ORAL | 0 refills | Status: DC

## 2021-09-29 NOTE — Telephone Encounter (Signed)
Opened in error

## 2021-10-16 ENCOUNTER — Ambulatory Visit (INDEPENDENT_AMBULATORY_CARE_PROVIDER_SITE_OTHER): Payer: 59

## 2021-10-16 DIAGNOSIS — R109 Unspecified abdominal pain: Secondary | ICD-10-CM | POA: Diagnosis not present

## 2021-10-16 DIAGNOSIS — R102 Pelvic and perineal pain: Secondary | ICD-10-CM

## 2021-10-20 ENCOUNTER — Telehealth: Payer: Self-pay | Admitting: Obstetrics and Gynecology

## 2021-10-20 NOTE — Telephone Encounter (Signed)
Spoke with patient and informed her of normal u/s result. I asked her about the [facility] but she said she has not yet had time to look into it. She said she will call  if she decides she wants to try it.

## 2021-10-20 NOTE — Telephone Encounter (Signed)
Please let the patient know that her pelvic ultrasound from 10/16/21 is normal. We had discussed the option of trying loseasonique, please see if she is interested? If so send in a 3 month pack.

## 2021-12-31 ENCOUNTER — Ambulatory Visit: Payer: 59 | Admitting: Obstetrics and Gynecology

## 2022-01-06 ENCOUNTER — Ambulatory Visit: Payer: 59 | Admitting: Obstetrics and Gynecology

## 2022-01-13 NOTE — Progress Notes (Signed)
GYNECOLOGY  VISIT   HPI: 26 y.o.   Single White or Caucasian Not Hispanic or Latino  female   G0P0000 with LMP: 12/23/21.   here for repeat chlamydia testing.  Positive Chlamydia 09/24/21. The rest of her STD testing was negative. She and her partner were both treated and used condoms for a while.  Since treatment her pain with her cycles and in BTB have improved.  Noticed bilateral breast nipple tenderness, breasts swollen last week.  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   Generalized anxiety disorder 10/31/2019   Psychophysiological insomnia 10/31/2019   Anxiety 12/30/2015   Acne 12/03/2015    Past Medical History:  Diagnosis Date   Abnormal Pap smear of cervix    2018 [facility]   Allergy    Angio-edema    Anxiety    Chlamydia 07/2019   Depression    Heart murmur    IBS (irritable bowel syndrome)    Urticaria     Past Surgical History:  Procedure Laterality Date   COLPOSCOPY     2018   LEEP     2018   WRIST SURGERY Left 2014 or 2015    Current Outpatient Medications  Medication Sig Dispense Refill   Adapalene-Benzoyl Peroxide (EPIDUO) 0.1-2.5 % gel Apply 1 application topically at bedtime. 45 g 0   busPIRone (BUSPAR) 15 MG tablet Take by mouth.     cetirizine (ZYRTEC) 10 MG tablet Take 10 mg by mouth daily.     doxycycline (MONODOX) 100 MG capsule Take 1 capsule (100 mg total) by mouth 2 (two) times daily. 14 capsule 0   fluticasone (FLONASE) 50 MCG/ACT nasal spray Place 2 sprays into both nostrils daily. 16 g 11   hydrOXYzine (ATARAX) 25 MG tablet Take by mouth.     LORYNA 3-0.02 MG tablet Take 1 tablet by  mouth daily.     No current facility-administered medications for this visit.     ALLERGIES: Bee venom  Family History  Problem Relation Age of Onset   Breast cancer Mother 86   Diabetes Mother    Hyperlipidemia Mother    Mental illness Mother    Cancer Father    Hyperlipidemia Father    Colon cancer Father 73       mets to his liver, died in 03/10/2023 at 47   Mental illness Sister    Mental illness Sister     Social History   Socioeconomic History   Marital status: Single    Spouse name: Not on file   Number of children: Not on file   Years of education: Not on file   Highest education level: Not on file  Occupational History   Not on file  Tobacco Use   Smoking status: Former    Packs/day: 0.20    Years: 4.00    Total pack years: 0.80    Types:  Cigarettes   Smokeless tobacco: Never   Tobacco comments:    smokes every now and then  Vaping Use   Vaping Use: Every day  Substance and Sexual Activity   Alcohol use: Not Currently    Comment: 6-10 drinks a week   Drug use: No   Sexual activity: Yes    Partners: Male    Birth control/protection: Pill    Comment: Tri-Sprintec  Other Topics Concern   Not on file  Social History Narrative   Not on file   Social Determinants of Health   Financial Resource Strain: Not on file  Food Insecurity: Not on file  Transportation Needs: Not on file  Physical Activity: Not on file  Stress: Not on file  Social Connections: Not on file  Intimate Partner Violence: Not on file    Review of Systems  All other systems reviewed and are negative.   PHYSICAL EXAMINATION:    There were no vitals taken for this visit.    General appearance: alert, cooperative and appears stated age  Pelvic: External genitalia:  no lesions              Urethra:  normal appearing urethra with no masses, tenderness or lesions              Bartholins and Skenes: normal                 Vagina: normal appearing vagina with normal color and  discharge, no lesions              Cervix: no lesions, evidence of prior leep              Chaperone was present for exam.  1. History of chlamydia Discussed chlamydia and the risk of tubal damage and ectopic pregnancy. Discussed the need for early evaluation in pregnancy - SURESWAB CT/NG/T. vaginalis

## 2022-01-16 ENCOUNTER — Ambulatory Visit: Payer: 59 | Admitting: Obstetrics and Gynecology

## 2022-01-16 ENCOUNTER — Encounter: Payer: Self-pay | Admitting: Obstetrics and Gynecology

## 2022-01-16 VITALS — BP 108/74 | Wt 130.0 lb

## 2022-01-16 DIAGNOSIS — Z8619 Personal history of other infectious and parasitic diseases: Secondary | ICD-10-CM

## 2022-01-16 NOTE — Patient Instructions (Signed)
Chlamydia, Female  Chlamydia is a sexually transmitted infection (STI). This infection spreads through sexual contact. Chlamydia can occur in different areas of the body, including: The urethra. This is the part of the body that drains urine from the bladder. The cervix. This is the lowest part of the uterus. The throat. The rectum. This condition is not difficult to treat. However, if left untreated, chlamydia can lead to more serious health problems, including pelvic inflammatory disease (PID). PID can increase your risk of being unable to have children. In pregnant women, untreated chlamydia can cause serious complications during pregnancy or health problems for the baby after delivery. What are the causes? This condition is caused by a bacteria called Chlamydia trachomatis. The bacteria are spread from an infected partner during sexual activity. Chlamydia can spread through contact with the genitals, mouth, or rectum. What increases the risk? The following factors may make you more likely to develop this condition: Not using a condom the right way or not using a condom every time you have sex. Having a new sex partner or having more than one sex partner. Being sexually active before age 25. What are the signs or symptoms? In some cases, there are no symptoms, especially early in the infection. If symptoms develop, they may include: Urinating often, or a burning feeling during urination. Redness, soreness, or swelling of the vagina or rectum. Discharge coming from the vagina or rectum. Pain in the abdomen. Pain during sex. Bleeding between menstrual periods or irregular periods. How is this diagnosed? This condition may be diagnosed with: Urine tests. Swab tests. Depending on your symptoms, your health care provider may use a cotton swab to collect a fluid sample from your vagina, rectum, nose, or throat to test for the bacteria. A pelvic exam. How is this treated? This condition is  treated with antibiotic medicines. Follow these instructions at home: Sexual activity Tell your sex partner or partners about your infection. These include any partners for oral, anal, or vaginal sex that you have had within 60 days of when your symptoms started. Sex partners should also be treated, even if they have no signs of the infection. Do not have sex until you and your sex partners have completed treatment and your health care provider says it is okay. If your health care provider prescribed you a single-dose medicine as treatment, wait at least 7 days after taking the medicine before having sex. General instructions Take over-the-counter and prescription medicines as told by your health care provider. Finish all antibiotic medicine even when you start to feel better. It is up to you to get your test results. Ask your health care provider, or the department that is doing the test, when your results will be ready. Keep all follow-up visits. This is important. You may need to be tested for infection again 3 months after treatment. How is this prevented? You can lower your risk of getting chlamydia by: Using latex or polyurethane condoms correctly every time you have sex. Not having multiple sex partners. Asking if your sex partner has been tested for STIs and had negative results. Getting regular health screenings to check for STIs. Contact a health care provider if: You develop new symptoms or your symptoms are getting worse. Your symptoms do not get better after treatment. You have a fever or chills. You have pain during sex. You have irregular menstrual periods, or you have bleeding between periods or after sex. You develop flu-like symptoms, such as night sweats, sore throat,   or muscle aches. You are unable to take your antibiotic medicine as prescribed. Summary Chlamydia is a sexually transmitted infection (STI) that is caused by bacteria. This infection spreads through sexual  contact. This condition is treated with antibiotic medicines. If left untreated, chlamydia can lead to more serious health problems, including pelvic inflammatory disease (PID). Your sex partners will also need to be treated. Do not have sex until both you and your partner have been treated. Take medicines as directed by your health care provider and keep all follow-up visits to ensure your infection has been completely treated. This information is not intended to replace advice given to you by your health care provider. Make sure you discuss any questions you have with your health care provider. Document Revised: 01/27/2021 Document Reviewed: 01/27/2021 Elsevier Patient Education  2023 Elsevier Inc[employer]  

## 2022-01-17 LAB — SURESWAB CT/NG/T. VAGINALIS
C. trachomatis RNA, TMA: NOT DETECTED
N. gonorrhoeae RNA, TMA: NOT DETECTED
Trichomonas vaginalis RNA: NOT DETECTED

## 2022-10-05 NOTE — Progress Notes (Signed)
27 y.o. G0P0000 Single White or Caucasian Not Hispanic or Latino female here for annual exam.  On OCP's. She is with the same partner, no dyspareunia.  Period Cycle (Days): 28 Period Duration (Days): 3-4 Period Pattern: Regular Menstrual Flow: Heavy Menstrual Control: Tampon Menstrual Control Change Freq (Hours): 2 Dysmenorrhea: (!) Moderate Dysmenorrhea Symptoms: Cramping (back pain)  She has seen GI for GERD, still having issues. She will go back to GI.  No diarrhea or constipation.   Patient's last menstrual period was 09/30/2022.          Sexually active: Yes.    The current method of family planning is OCP (estrogen/progesterone).    Exercising: No.  The patient does not participate in regular exercise at present. Smoker:  yes Vaping   Health Maintenance: Pap: 08/01/2019 WNL Hr hpv neg  02/14/18 Normal HPV Neg, 12/09/16 [facility] History of abnormal Pap:  Yes. H/O leep in 2018, CIN I and II MMG:  n/a  BMD:   n/a Colonoscopy: none  TDaP:  12/20/2015 Gardasil: complete    reports that she has quit smoking. Her smoking use included cigarettes. She has a 0.80 pack-year smoking history. She has never used smokeless tobacco. She reports that she does not currently use alcohol. She reports that she does not use drugs. 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   Past Medical History:  Diagnosis Date   Abnormal Pap smear of cervix    2018 [facility]   Allergy    Angio-edema    Anxiety    Chlamydia 07/2019   Depression    Heart murmur    IBS (irritable bowel syndrome)    Urticaria     Past Surgical History:  Procedure Laterality Date   COLPOSCOPY     2018   LEEP     2018   WRIST SURGERY Left 2014 or 2015    Current Outpatient Medications  Medication Sig Dispense Refill   cetirizine (ZYRTEC) 10 MG tablet Take 10 mg by mouth daily.     fluticasone (FLONASE) 50 MCG/ACT nasal spray Place 2 sprays into both nostrils daily. 16 g 11    hydrOXYzine (ATARAX) 25 MG tablet Take by mouth.     LORYNA 3-0.02 MG tablet Take 1 tablet by mouth daily.     No current facility-administered medications for this visit.    Family History  Problem Relation Age of Onset   Breast cancer Mother 51   Diabetes Mother    Hyperlipidemia Mother    Mental illness Mother    Cancer Father    Hyperlipidemia Father    Colon cancer Father 66       mets to his liver, died in February 26, 2023 at 102   Mental illness Sister    Mental illness Sister   Sister with colon polyps at 61 (thinks benign).   Review of Systems  Exam:   BP 110/70   Pulse 66   Ht 5\' 2"  (1.575 m)   Wt 134 lb (60.8 kg)   LMP 09/30/2022   SpO2 100%   BMI 24.51 kg/m   Weight change: @WEIGHTCHANGE @ Height:   Height: 5\' 2"  (157.5 cm)  Ht Readings from Last 3 Encounters:  10/14/22 5\' 2"  (1.575 m)  09/24/21 5\' 2"  (1.575 m)  09/12/20 5\' 2"  (1.575 m)    General appearance: alert, cooperative and appears stated age Head: Normocephalic, without obvious abnormality, atraumatic Neck: no adenopathy, supple, symmetrical, trachea midline and thyroid normal  to inspection and palpation Lungs: clear to auscultation bilaterally Cardiovascular: regular rate and rhythm Breasts: normal appearance, no masses or tenderness Abdomen: soft, non-tender; non distended,  no masses,  no organomegaly Extremities: extremities normal, atraumatic, no cyanosis or edema Skin: Skin color, texture, turgor normal. No rashes or lesions Lymph nodes: Cervical, supraclavicular, and axillary nodes normal. No abnormal inguinal nodes palpated Neurologic: Grossly normal   Pelvic: External genitalia:  no lesions              Urethra:  normal appearing urethra with no masses, tenderness or lesions              Bartholins and Skenes: normal                 Vagina: normal appearing vagina with normal color and discharge, no lesions              Cervix: no lesions and friable with pap               Bimanual Exam:   Uterus:  normal size, contour, position, consistency, mobility, non-tender              Adnexa: no mass, fullness, tenderness               Rectovaginal: Confirms               Anus:  normal sphincter tone, no lesions  Carolynn Serve, CMA chaperoned for the exam.  1. Well woman exam Discussed breast self exam Discussed calcium and vit D intake No screening labs this year  2. Screening for cervical cancer - Cytology - PAP  3. Screening examination for STD (sexually transmitted disease) Declines blood work - Cytology - PAP  4. Encounter for surveillance of contraceptive pills Doing well - LORYNA 3-0.02 MG tablet; Take 1 tablet by mouth daily.  Dispense: 84 tablet; Refill: 3

## 2022-10-14 ENCOUNTER — Encounter: Payer: Self-pay | Admitting: Obstetrics and Gynecology

## 2022-10-14 ENCOUNTER — Ambulatory Visit (INDEPENDENT_AMBULATORY_CARE_PROVIDER_SITE_OTHER): Payer: BLUE CROSS/BLUE SHIELD | Admitting: Obstetrics and Gynecology

## 2022-10-14 ENCOUNTER — Other Ambulatory Visit (HOSPITAL_COMMUNITY)
Admission: RE | Admit: 2022-10-14 | Discharge: 2022-10-14 | Disposition: A | Discharge status: Home / Self Care | Payer: BLUE CROSS/BLUE SHIELD | Source: Ambulatory Visit | Attending: Obstetrics and Gynecology | Admitting: Obstetrics and Gynecology

## 2022-10-14 VITALS — BP 110/70 | HR 66 | Ht 62.0 in | Wt 134.0 lb

## 2022-10-14 DIAGNOSIS — Z3041 Encounter for surveillance of contraceptive pills: Secondary | ICD-10-CM

## 2022-10-14 DIAGNOSIS — Z113 Encounter for screening for infections with a predominantly sexual mode of transmission: Secondary | ICD-10-CM | POA: Insufficient documentation

## 2022-10-14 DIAGNOSIS — Z124 Encounter for screening for malignant neoplasm of cervix: Secondary | ICD-10-CM | POA: Insufficient documentation

## 2022-10-14 DIAGNOSIS — Z01419 Encounter for gynecological examination (general) (routine) without abnormal findings: Secondary | ICD-10-CM

## 2022-10-14 MED ORDER — LORYNA 3-0.02 MG PO TABS: 1.0000 | ORAL_TABLET | Freq: Every day | ORAL | 3 refills | Status: AC

## 2022-10-14 NOTE — Patient Instructions (Signed)

## 2022-10-16 LAB — CYTOLOGY - PAP
Chlamydia: NEGATIVE
Comment: NEGATIVE
Comment: NEGATIVE
Comment: NORMAL
Diagnosis: NEGATIVE
High risk HPV: NEGATIVE
Neisseria Gonorrhea: NEGATIVE

## 2023-04-16 ENCOUNTER — Ambulatory Visit: Payer: BLUE CROSS/BLUE SHIELD | Admitting: Radiology

## 2023-04-19 ENCOUNTER — Encounter: Payer: Self-pay | Admitting: Radiology

## 2023-04-19 ENCOUNTER — Ambulatory Visit: Payer: BLUE CROSS/BLUE SHIELD | Admitting: Radiology

## 2023-04-19 VITALS — BP 132/84 | HR 87

## 2023-04-19 DIAGNOSIS — N760 Acute vaginitis: Secondary | ICD-10-CM | POA: Diagnosis not present

## 2023-04-19 DIAGNOSIS — Z113 Encounter for screening for infections with a predominantly sexual mode of transmission: Secondary | ICD-10-CM

## 2023-04-19 LAB — WET PREP FOR TRICH, YEAST, CLUE

## 2023-04-19 MED ORDER — METRONIDAZOLE 0.75 % VA GEL: 1.0000 | Freq: Every day | VAGINAL | 0 refills | Status: AC

## 2023-04-19 NOTE — Patient Instructions (Signed)

## 2023-04-19 NOTE — Progress Notes (Signed)
      Subjective: Amanda Gutierrez is a 27 y.o. female who complains of vaginal itching, no odor, no discharge. Desires STI screening, has a new partner.   Review of Systems  All other systems reviewed and are negative.   Past Medical History:  Diagnosis Date   Abnormal Pap smear of cervix    2018 [facility]II   Allergy    Angio-edema    Anxiety    Chlamydia 07/2019   Depression    Heart murmur    IBS (irritable bowel syndrome)    Urticaria       Objective:  Today's Vitals   04/19/23 1456  BP: 132/84  Pulse: 87  SpO2: 99%   There is no height or weight on file to calculate BMI.   Physical Exam Vitals and nursing note reviewed. Exam conducted with a chaperone present.  Constitutional:      Appearance: Normal appearance. She is well-developed.  Pulmonary:     Effort: Pulmonary effort is normal.  Abdominal:     General: Abdomen is flat.     Palpations: Abdomen is soft.  Genitourinary:    General: Normal vulva.     Vagina: Vaginal discharge present. No erythema, bleeding or lesions.     Cervix: Normal. No discharge, friability, lesion or erythema.     Uterus: Normal.      Adnexa: Right adnexa normal and left adnexa normal.  Neurological:     Mental Status: She is alert.  Psychiatric:        Mood and Affect: Mood normal.        Thought Content: Thought content normal.        Judgment: Judgment normal.      Microscopic wet-mount exam shows clue cells.   Raynelle Fanning, CMA present for exam  Assessment:/Plan:   1. Acute vaginitis (Primary) - WET PREP FOR TRICH, YEAST, CLUE +BV rx sent for metrogel  2. Screening for STDs (sexually transmitted diseases) - SURESWAB CT/NG/T. vaginalis    Will contact patient with results of testing completed today. Avoid intercourse until symptoms are resolved. Safe sex encouraged. Avoid the use of soaps or perfumed products in the peri area. Avoid tub baths and sitting in sweaty or wet clothing for prolonged periods of time.    Return if symptoms worsen or fail to improve.   Hitoshi Werts B, NP 3:10 PM

## 2023-04-20 LAB — SURESWAB CT/NG/T. VAGINALIS
C. trachomatis RNA, TMA: NOT DETECTED
N. gonorrhoeae RNA, TMA: NOT DETECTED
Trichomonas vaginalis RNA: NOT DETECTED
